# Patient Record
Sex: Female | Born: 1935 | Race: White | Hispanic: No
Health system: Southern US, Community
[De-identification: ages and names within clinical notes are randomized; demographics above are authoritative.]

---

## 2002-10-29 ENCOUNTER — Encounter: Payer: Self-pay | Admitting: Orthopedic Surgery

## 2002-11-05 ENCOUNTER — Inpatient Hospital Stay (HOSPITAL_COMMUNITY): Admission: RE | Admit: 2002-11-05 | Discharge: 2002-11-06 | Payer: Self-pay | Admitting: Orthopedic Surgery

## 2002-12-20 ENCOUNTER — Encounter: Admission: RE | Admit: 2002-12-20 | Discharge: 2003-02-07 | Payer: Self-pay | Admitting: Orthopedic Surgery

## 2003-02-17 ENCOUNTER — Encounter: Payer: Self-pay | Admitting: Family Medicine

## 2003-02-17 ENCOUNTER — Encounter: Admission: RE | Admit: 2003-02-17 | Discharge: 2003-02-17 | Payer: Self-pay | Admitting: Family Medicine

## 2003-08-13 ENCOUNTER — Ambulatory Visit (HOSPITAL_COMMUNITY): Admission: RE | Admit: 2003-08-13 | Discharge: 2003-08-13 | Payer: Self-pay | Admitting: Family Medicine

## 2003-08-13 ENCOUNTER — Encounter: Payer: Self-pay | Admitting: Family Medicine

## 2003-08-16 ENCOUNTER — Encounter: Payer: Self-pay | Admitting: Family Medicine

## 2003-08-16 ENCOUNTER — Encounter: Admission: RE | Admit: 2003-08-16 | Discharge: 2003-08-16 | Payer: Self-pay | Admitting: Family Medicine

## 2003-09-30 ENCOUNTER — Ambulatory Visit (HOSPITAL_COMMUNITY): Admission: RE | Admit: 2003-09-30 | Discharge: 2003-09-30 | Payer: Self-pay | Admitting: *Deleted

## 2003-09-30 ENCOUNTER — Encounter (INDEPENDENT_AMBULATORY_CARE_PROVIDER_SITE_OTHER): Payer: Self-pay | Admitting: Specialist

## 2003-11-24 ENCOUNTER — Encounter (INDEPENDENT_AMBULATORY_CARE_PROVIDER_SITE_OTHER): Payer: Self-pay | Admitting: Specialist

## 2003-11-24 ENCOUNTER — Encounter (INDEPENDENT_AMBULATORY_CARE_PROVIDER_SITE_OTHER): Payer: Self-pay | Admitting: *Deleted

## 2003-11-24 ENCOUNTER — Inpatient Hospital Stay (HOSPITAL_COMMUNITY): Admission: RE | Admit: 2003-11-24 | Discharge: 2003-11-27 | Payer: Self-pay | Admitting: *Deleted

## 2004-03-13 ENCOUNTER — Encounter: Admission: RE | Admit: 2004-03-13 | Discharge: 2004-04-04 | Payer: Self-pay | Admitting: Orthopedic Surgery

## 2004-03-21 ENCOUNTER — Other Ambulatory Visit: Admission: RE | Admit: 2004-03-21 | Discharge: 2004-03-21 | Payer: Self-pay | Admitting: *Deleted

## 2004-06-13 ENCOUNTER — Inpatient Hospital Stay (HOSPITAL_COMMUNITY): Admission: RE | Admit: 2004-06-13 | Discharge: 2004-06-18 | Payer: Self-pay | Admitting: Orthopedic Surgery

## 2004-06-13 ENCOUNTER — Encounter (INDEPENDENT_AMBULATORY_CARE_PROVIDER_SITE_OTHER): Payer: Self-pay | Admitting: Specialist

## 2004-06-18 ENCOUNTER — Inpatient Hospital Stay
Admission: AD | Admit: 2004-06-18 | Discharge: 2004-06-25 | Payer: Self-pay | Admitting: Physical Medicine & Rehabilitation

## 2004-12-19 ENCOUNTER — Ambulatory Visit (HOSPITAL_COMMUNITY): Admission: RE | Admit: 2004-12-19 | Discharge: 2004-12-19 | Payer: Self-pay | Admitting: *Deleted

## 2005-02-25 ENCOUNTER — Encounter: Admission: RE | Admit: 2005-02-25 | Discharge: 2005-03-14 | Payer: Self-pay | Admitting: Orthopedic Surgery

## 2005-06-12 ENCOUNTER — Inpatient Hospital Stay (HOSPITAL_COMMUNITY): Admission: RE | Admit: 2005-06-12 | Discharge: 2005-06-15 | Payer: Self-pay | Admitting: Orthopedic Surgery

## 2005-06-13 ENCOUNTER — Ambulatory Visit: Payer: Self-pay | Admitting: Physical Medicine & Rehabilitation

## 2005-06-15 ENCOUNTER — Inpatient Hospital Stay
Admission: RE | Admit: 2005-06-15 | Discharge: 2005-06-19 | Payer: Self-pay | Admitting: Physical Medicine & Rehabilitation

## 2005-06-26 IMAGING — CR DG CHEST 2V
2 series · 2 of 2 positions shown · non-contrast
Comparison: none

CLINICAL DATA: 84-year-old with history of endometrial cancer.
 CHEST - 2 VIEW:
 Two views of the chest demonstrate changes consistent with COPD with hyperinflation, attenuation of the pulmonary vasculature.  Heart size is within normal limits and mediastinal and hilar contours are normal.  No acute pulmonary findings and no pulmonary masses or nodules to suggest metastatic disease.

[view not recorded (1 of 2)]
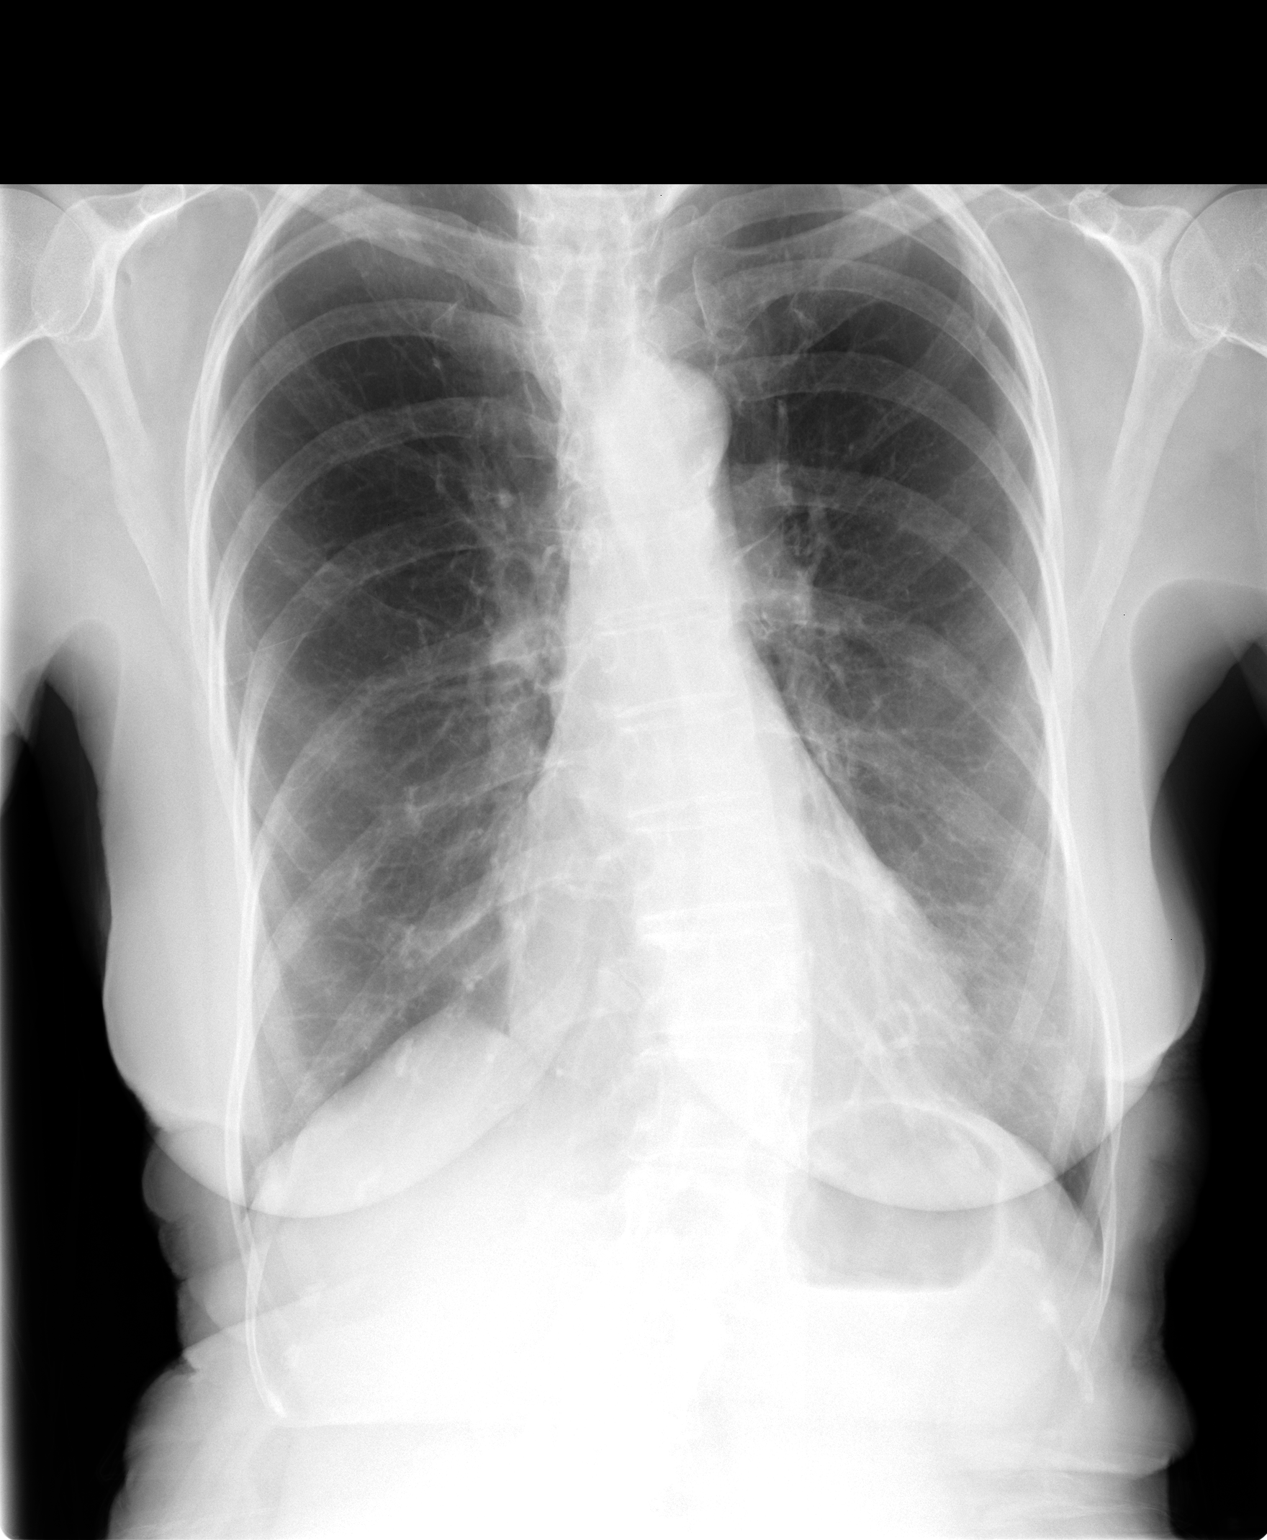

[view not recorded (2 of 2)]
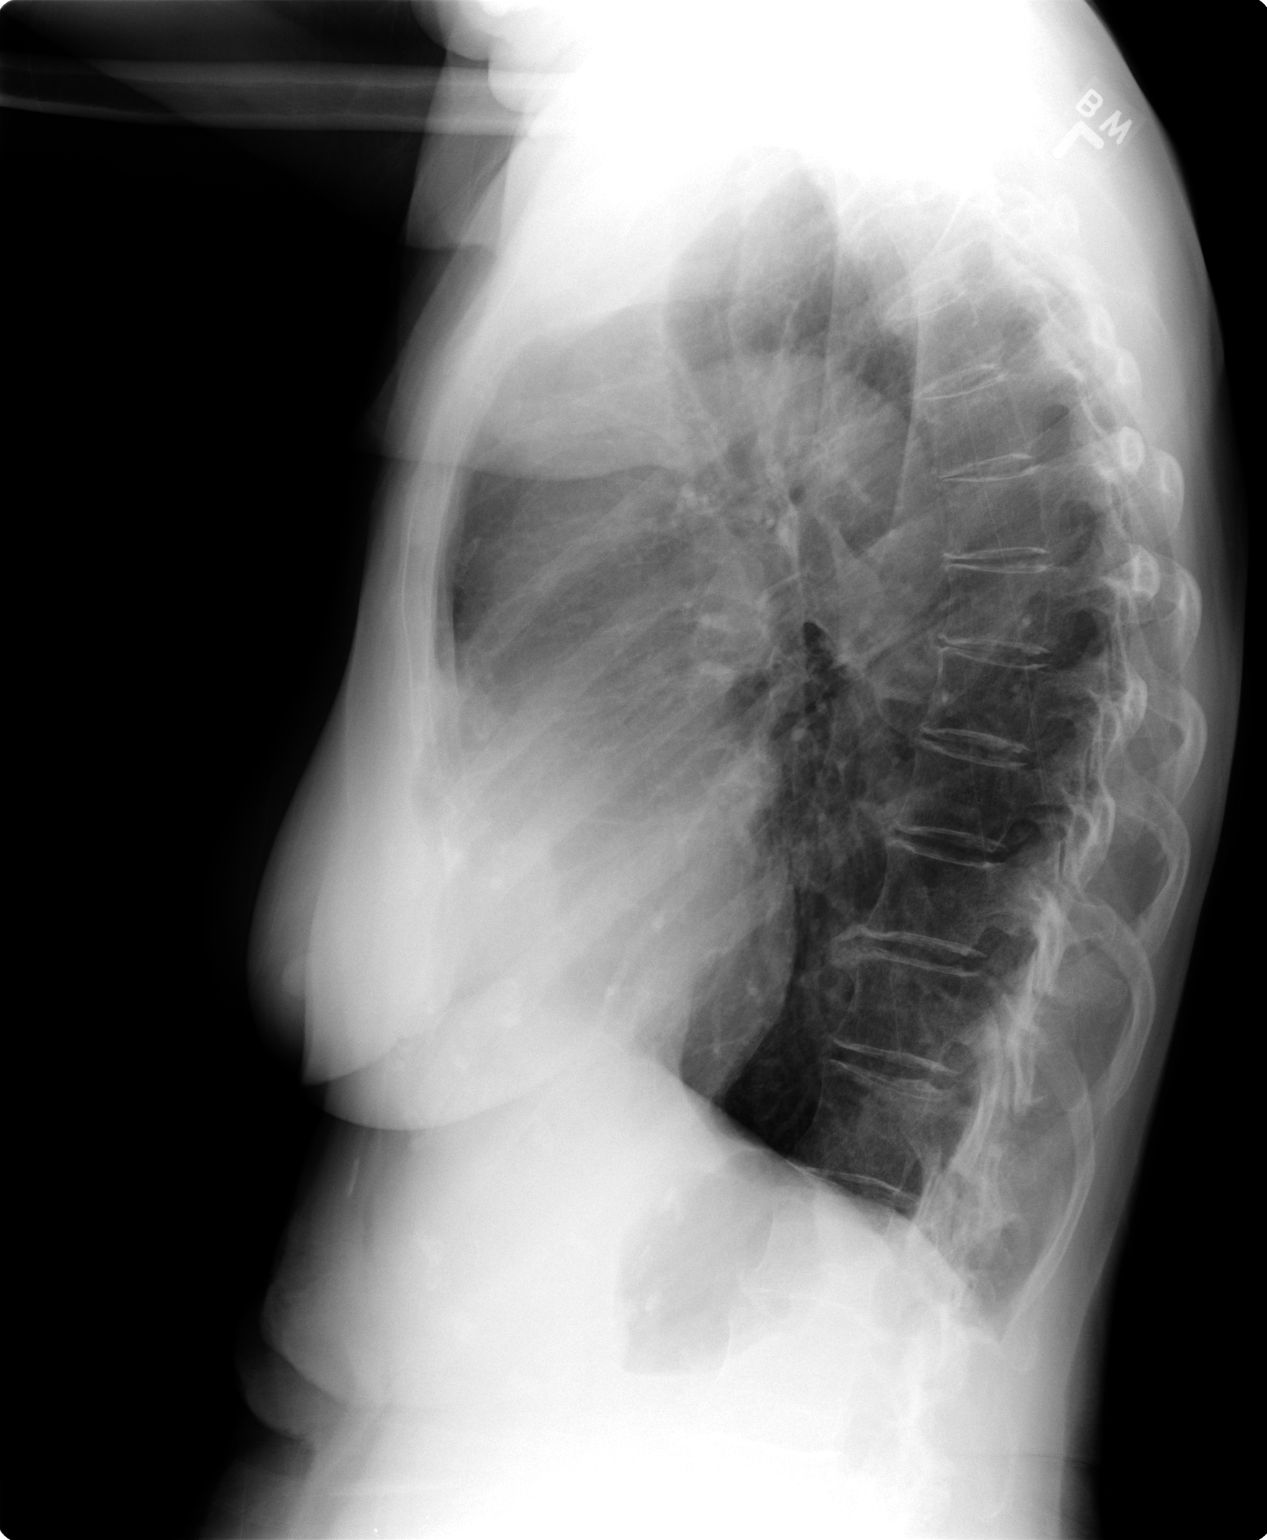

[2 of 2 positions shown; findings below may reference images not displayed]

IMPRESSION: COPD changes.  No acute pulmonary findings and no plain film evidence of metastatic disease to the chest.

## 2005-07-15 ENCOUNTER — Encounter: Admission: RE | Admit: 2005-07-15 | Discharge: 2005-07-22 | Payer: Self-pay | Admitting: Orthopedic Surgery

## 2007-03-18 ENCOUNTER — Ambulatory Visit: Payer: Self-pay | Admitting: Internal Medicine

## 2007-03-24 ENCOUNTER — Ambulatory Visit: Payer: Self-pay | Admitting: Internal Medicine

## 2008-04-06 ENCOUNTER — Encounter: Admission: RE | Admit: 2008-04-06 | Discharge: 2008-04-06 | Payer: Self-pay | Admitting: Obstetrics and Gynecology

## 2008-05-23 ENCOUNTER — Inpatient Hospital Stay (HOSPITAL_COMMUNITY): Admission: EM | Admit: 2008-05-23 | Discharge: 2008-05-25 | Payer: Self-pay | Admitting: Emergency Medicine

## 2008-05-24 ENCOUNTER — Encounter (INDEPENDENT_AMBULATORY_CARE_PROVIDER_SITE_OTHER): Payer: Self-pay | Admitting: Internal Medicine

## 2008-05-24 ENCOUNTER — Ambulatory Visit: Payer: Self-pay | Admitting: Surgery

## 2009-06-13 ENCOUNTER — Encounter: Admission: RE | Admit: 2009-06-13 | Discharge: 2009-06-13 | Payer: Self-pay | Admitting: Obstetrics and Gynecology

## 2010-01-07 ENCOUNTER — Inpatient Hospital Stay (HOSPITAL_COMMUNITY): Admission: EM | Admit: 2010-01-07 | Discharge: 2010-01-09 | Payer: Self-pay | Admitting: Emergency Medicine

## 2010-06-01 ENCOUNTER — Ambulatory Visit: Payer: Self-pay | Admitting: Internal Medicine

## 2010-06-01 ENCOUNTER — Inpatient Hospital Stay (HOSPITAL_COMMUNITY): Admission: EM | Admit: 2010-06-01 | Discharge: 2010-06-03 | Payer: Self-pay | Admitting: Emergency Medicine

## 2011-01-15 NOTE — Procedures (Signed)
Summary: Upper Endoscopy  Patient: Tamara [name] Note: All result statuses are Final unless otherwise noted.  Tests: (1) Upper Endoscopy (EGD)   EGD Upper Endoscopy       DONE     [facility]     [address] [place], [place]  [address]           ENDOSCOPY PROCEDURE REPORT           PATIENT:  Tamara [name], Tamara [name]  MR#:  [id number]     BIRTHDATE:  1920/06/09, 89 yrs. old  GENDER:  female           ENDOSCOPIST:  Iva Boop, MD, [facility]     Referred by:  [facility],           PROCEDURE DATE:  06/01/2010     PROCEDURE:  EGD with biopsy for H. pylori     ASA CLASS:  Class III     INDICATIONS:  hematemesis, melena           MEDICATIONS:   Fentanyl 50 mcg, Versed 3 mg     TOPICAL ANESTHETIC:  Cetacaine Spray           DESCRIPTION OF PROCEDURE:   After the risks benefits and     alternatives of the procedure were thoroughly explained, informed     consent was obtained.  The  endoscope was introduced through the     mouth and advanced to the second portion of the duodenum, without     limitations.  The instrument was slowly withdrawn as the mucosa     was fully examined.     <<PROCEDUREIMAGES>>           ENDOSCOPIC FINDINGS:  Esophagitis was found. 25-32 cm, with     denuded, inflamed mucosa and ulceration, especially near     gastroesophageal junction. Some of the mucosa looks columnar and     could be Barrett's.  A hiatal hernia was found. It was 8 cm in     size. 32-40 cm.  Multiple ulcers were found in the body of the     stomach. On and near diaphragmatic impingement of hiatus hernia.     Some flat pigmented changes but no visible vessels. maximum ulcer     size 7-8 mm.  Otherwise the examination was normal. A biopsy for     H. pylori was taken.    Retroflexed views revealed Retroflexion     exam demonstrated findings as previously described.    The scope     was then withdrawn from the patient and the procedure completed.           COMPLICATIONS:  None             ENDOSCOPIC IMPRESSION:     1) Esophagitis - GERD abnd severe     2) 8 cm hiatal hernia     3) Ulcers, multiple in the body of the stomach - at     diaphragmatic impingement consistent witrh Cameron's ulcers     4) Otherwise normal examination     RECOMMENDATIONS:     bid PPI x 2 months at least (start IV then po)     transfuse if needed     iron therapy - po     treat H. pylori if present     dc ASA and Celebrex     possibly resume NSAID's in 1-2 mos but only under MD direction  and must stay on PPI     consider reassessment EGD depending upon clinical issues but     probably will not need     she may have Barrett's esophagus but at 89 and with     comorbidities do not think that is an issue needing dx and would     not follow     - she may be ble to go home in next 24-48 hrs if does well           REPEAT EXAM:  In for as needed.           Iva Boop, MD, [facility]           CC:  Marjory Lies, MD           n.     eSIGNED:   Iva Boop at 06/01/2010 07:08 PM           Arman Bogus, [id number]  Note: An exclamation mark (!) indicates a result that was not dispersed into the flowsheet. Document Creation Date: 06/01/2010 7:09 PM _______________________________________________________________________  (1) Order result status: Final Collection or observation date-time: 06/01/2010 18:45 Requested date-time:  Receipt date-time:  Reported date-time:  Referring Physician:   Ordering Physician: Stan Head [phone]) Specimen Source:  Source: Launa Grill Order Number: [id number] Lab site:

## 2011-03-03 LAB — BASIC METABOLIC PANEL
BUN: 42 mg/dL — ABNORMAL HIGH (ref 6–23)
BUN: 45 mg/dL — ABNORMAL HIGH (ref 6–23)
CO2: 21 mEq/L (ref 19–32)
CO2: 23 mEq/L (ref 19–32)
Calcium: 8.6 mg/dL (ref 8.4–10.5)
Calcium: 8.8 mg/dL (ref 8.4–10.5)
Chloride: 106 mEq/L (ref 96–112)
Creatinine, Ser: 2.11 mg/dL — ABNORMAL HIGH (ref 0.4–1.2)
GFR calc Af Amer: 32 mL/min — ABNORMAL LOW (ref >60)
GFR calc non Af Amer: 22 mL/min — ABNORMAL LOW (ref >60)
Glucose, Bld: 127 mg/dL — ABNORMAL HIGH (ref 70–99)
Potassium: 3.4 mEq/L — ABNORMAL LOW (ref 3.5–5.1)
Potassium: 5.6 mEq/L — ABNORMAL HIGH (ref 3.5–5.1)
Sodium: 141 mEq/L (ref 135–145)

## 2011-03-03 LAB — CBC
HCT: 29.4 % — ABNORMAL LOW (ref 36.0–46.0)
HCT: 32.8 % — ABNORMAL LOW (ref 36.0–46.0)
HCT: 28.3 % — ABNORMAL LOW (ref 36.0–46.0)
HCT: 28.4 % — ABNORMAL LOW (ref 36.0–46.0)
Hemoglobin: 10 g/dL — ABNORMAL LOW (ref 12.0–15.0)
Hemoglobin: 9.8 g/dL — ABNORMAL LOW (ref 12.0–15.0)
Hemoglobin: 10.4 g/dL — ABNORMAL LOW (ref 12.0–15.0)
Hemoglobin: 9.3 g/dL — ABNORMAL LOW (ref 12.0–15.0)
MCHC: 33.9 g/dL (ref 30.0–36.0)
MCHC: 34.2 g/dL (ref 30.0–36.0)
MCHC: 32.8 g/dL (ref 30.0–36.0)
MCHC: 33.2 g/dL (ref 30.0–36.0)
MCHC: 33.3 g/dL (ref 30.0–36.0)
MCV: 92.8 fL (ref 78.0–100.0)
MCV: 88.1 fL (ref 78.0–100.0)
MCV: 88.7 fL (ref 78.0–100.0)
MCV: 89.9 fL (ref 78.0–100.0)
Platelets: 136 10*3/uL — ABNORMAL LOW (ref 150–400)
Platelets: 147 10*3/uL — ABNORMAL LOW (ref 150–400)
Platelets: 164 10*3/uL (ref 150–400)
RBC: 3.19 MIL/uL — ABNORMAL LOW (ref 3.87–5.11)
RBC: 3.54 MIL/uL — ABNORMAL LOW (ref 3.87–5.11)
RBC: 3.17 MIL/uL — ABNORMAL LOW (ref 3.87–5.11)
RBC: 3.23 MIL/uL — ABNORMAL LOW (ref 3.87–5.11)
RBC: 3.54 MIL/uL — ABNORMAL LOW (ref 3.87–5.11)
RDW: 13 % (ref 11.5–15.5)
RDW: 13.3 % (ref 11.5–15.5)
RDW: 13.3 % (ref 11.5–15.5)
WBC: 9.1 10*3/uL (ref 4.0–10.5)
WBC: 8.1 10*3/uL (ref 4.0–10.5)

## 2011-03-03 LAB — CROSSMATCH: Antibody Screen: NEGATIVE

## 2011-03-03 LAB — IRON AND TIBC
Iron: 57 ug/dL (ref 42–135)
TIBC: 302 ug/dL (ref 250–470)
UIBC: 245 ug/dL

## 2011-03-03 LAB — DIFFERENTIAL
Basophils Absolute: 0.1 10*3/uL (ref 0.0–0.1)
Basophils Absolute: 0.1 10*3/uL (ref 0.0–0.1)
Basophils Relative: 1 % (ref 0–1)
Basophils Relative: 1 % (ref 0–1)
Eosinophils Absolute: 0.3 10*3/uL (ref 0.0–0.7)
Lymphocytes Relative: 24 % (ref 12–46)
Monocytes Absolute: 1.1 10*3/uL — ABNORMAL HIGH (ref 0.1–1.0)
Monocytes Relative: 9 % (ref 3–12)
Neutro Abs: 5 10*3/uL (ref 1.7–7.7)
Neutrophils Relative %: 63 % (ref 43–77)

## 2011-03-03 LAB — FERRITIN: Ferritin: 22 ng/mL (ref 10–291)

## 2011-03-03 LAB — LIPID PANEL
LDL Cholesterol: 85 mg/dL (ref 0–99)
Triglycerides: 134 mg/dL (ref <150)
VLDL: 27 mg/dL (ref 0–40)

## 2011-03-03 LAB — COMPREHENSIVE METABOLIC PANEL
Alkaline Phosphatase: 38 U/L — ABNORMAL LOW (ref 39–117)
BUN: 18 mg/dL (ref 6–23)
CO2: 28 mEq/L (ref 19–32)
Calcium: 8.9 mg/dL (ref 8.4–10.5)
Calcium: 9.1 mg/dL (ref 8.4–10.5)
Creatinine, Ser: 1.45 mg/dL — ABNORMAL HIGH (ref 0.4–1.2)
GFR calc non Af Amer: 34 mL/min — ABNORMAL LOW (ref >60)
Glucose, Bld: 102 mg/dL — ABNORMAL HIGH (ref 70–99)
Glucose, Bld: 90 mg/dL (ref 70–99)
Total Protein: 5.7 g/dL — ABNORMAL LOW (ref 6.0–8.3)

## 2011-03-03 LAB — RETICULOCYTES: Retic Count, Absolute: 36.1 10*3/uL (ref 19.0–186.0)

## 2011-03-03 LAB — CK TOTAL AND CKMB (NOT AT ARMC)
CK, MB: 0.9 ng/mL (ref 0.3–4.0)
CK, MB: 1.3 ng/mL (ref 0.3–4.0)
Relative Index: INVALID (ref 0.0–2.5)
Relative Index: INVALID (ref 0.0–2.5)
Total CK: 40 U/L (ref 7–177)
Total CK: 54 U/L (ref 7–177)
Total CK: 74 U/L (ref 7–177)

## 2011-03-03 LAB — POTASSIUM: Potassium: 4.3 mEq/L (ref 3.5–5.1)

## 2011-03-03 LAB — ABO/RH: ABO/RH(D): O POS

## 2011-03-03 LAB — PROTIME-INR: INR: 1.01 (ref 0.00–1.49)

## 2011-04-06 ENCOUNTER — Inpatient Hospital Stay (HOSPITAL_COMMUNITY)
Admission: EM | Admit: 2011-04-06 | Discharge: 2011-04-13 | DRG: 194 | Disposition: A | Discharge status: Skilled Nursing Facility | Payer: Medicare Other | Attending: Family Medicine | Admitting: Family Medicine

## 2011-04-06 ENCOUNTER — Emergency Department (HOSPITAL_COMMUNITY): Payer: Medicare Other

## 2011-04-06 DIAGNOSIS — K5900 Constipation, unspecified: Secondary | ICD-10-CM | POA: Diagnosis present

## 2011-04-06 DIAGNOSIS — J189 Pneumonia, unspecified organism: Principal | ICD-10-CM | POA: Diagnosis present

## 2011-04-06 DIAGNOSIS — I5030 Unspecified diastolic (congestive) heart failure: Secondary | ICD-10-CM | POA: Diagnosis present

## 2011-04-06 DIAGNOSIS — I10 Essential (primary) hypertension: Secondary | ICD-10-CM | POA: Diagnosis present

## 2011-04-06 DIAGNOSIS — G309 Alzheimer's disease, unspecified: Secondary | ICD-10-CM | POA: Diagnosis present

## 2011-04-06 DIAGNOSIS — Z79899 Other long term (current) drug therapy: Secondary | ICD-10-CM

## 2011-04-06 DIAGNOSIS — E871 Hypo-osmolality and hyponatremia: Secondary | ICD-10-CM | POA: Diagnosis present

## 2011-04-06 DIAGNOSIS — E876 Hypokalemia: Secondary | ICD-10-CM | POA: Diagnosis present

## 2011-04-06 DIAGNOSIS — Z7982 Long term (current) use of aspirin: Secondary | ICD-10-CM

## 2011-04-06 DIAGNOSIS — K219 Gastro-esophageal reflux disease without esophagitis: Secondary | ICD-10-CM | POA: Diagnosis present

## 2011-04-06 DIAGNOSIS — M1990 Unspecified osteoarthritis, unspecified site: Secondary | ICD-10-CM | POA: Diagnosis present

## 2011-04-06 DIAGNOSIS — I509 Heart failure, unspecified: Secondary | ICD-10-CM | POA: Diagnosis present

## 2011-04-06 DIAGNOSIS — E878 Other disorders of electrolyte and fluid balance, not elsewhere classified: Secondary | ICD-10-CM | POA: Diagnosis present

## 2011-04-06 DIAGNOSIS — J4489 Other specified chronic obstructive pulmonary disease: Secondary | ICD-10-CM | POA: Diagnosis present

## 2011-04-06 DIAGNOSIS — R339 Retention of urine, unspecified: Secondary | ICD-10-CM | POA: Diagnosis present

## 2011-04-06 DIAGNOSIS — J449 Chronic obstructive pulmonary disease, unspecified: Secondary | ICD-10-CM | POA: Diagnosis present

## 2011-04-06 DIAGNOSIS — F0280 Dementia in other diseases classified elsewhere without behavioral disturbance: Secondary | ICD-10-CM | POA: Diagnosis present

## 2011-04-06 LAB — CBC
MCH: 28.9 pg (ref 26.0–34.0)
MCHC: 33.5 g/dL (ref 30.0–36.0)
MCV: 86.2 fL (ref 78.0–100.0)
Platelets: 172 10*3/uL (ref 150–400)
RDW: 13.5 % (ref 11.5–15.5)
WBC: 15.4 10*3/uL — ABNORMAL HIGH (ref 4.0–10.5)

## 2011-04-06 LAB — COMPREHENSIVE METABOLIC PANEL
Albumin: 3 g/dL — ABNORMAL LOW (ref 3.5–5.2)
BUN: 23 mg/dL (ref 6–23)
Calcium: 9.4 mg/dL (ref 8.4–10.5)
Creatinine, Ser: 1.42 mg/dL — ABNORMAL HIGH (ref 0.4–1.2)
Potassium: 4.3 mEq/L (ref 3.5–5.1)
Total Protein: 7 g/dL (ref 6.0–8.3)

## 2011-04-06 LAB — DIFFERENTIAL
Eosinophils Absolute: 0.1 10*3/uL (ref 0.0–0.7)
Eosinophils Relative: 0 % (ref 0–5)
Lymphs Abs: 1.5 10*3/uL (ref 0.7–4.0)
Monocytes Absolute: 1.8 10*3/uL — ABNORMAL HIGH (ref 0.1–1.0)

## 2011-04-07 LAB — CBC
HCT: 30.5 % — ABNORMAL LOW (ref 36.0–46.0)
MCV: 86.6 fL (ref 78.0–100.0)
RBC: 3.52 MIL/uL — ABNORMAL LOW (ref 3.87–5.11)
WBC: 14.7 10*3/uL — ABNORMAL HIGH (ref 4.0–10.5)

## 2011-04-07 LAB — BASIC METABOLIC PANEL
BUN: 22 mg/dL (ref 6–23)
Chloride: 96 mEq/L (ref 96–112)
Glucose, Bld: 141 mg/dL — ABNORMAL HIGH (ref 70–99)
Potassium: 4 mEq/L (ref 3.5–5.1)
Sodium: 132 mEq/L — ABNORMAL LOW (ref 135–145)

## 2011-04-07 LAB — TSH: TSH: 1.351 u[IU]/mL (ref 0.350–4.500)

## 2011-04-07 LAB — BRAIN NATRIURETIC PEPTIDE: Pro B Natriuretic peptide (BNP): 272 pg/mL — ABNORMAL HIGH (ref 0.0–100.0)

## 2011-04-08 ENCOUNTER — Inpatient Hospital Stay (HOSPITAL_COMMUNITY): Payer: Medicare Other

## 2011-04-08 LAB — CBC
HCT: 29.6 % — ABNORMAL LOW (ref 36.0–46.0)
Hemoglobin: 9.8 g/dL — ABNORMAL LOW (ref 12.0–15.0)
MCHC: 33.1 g/dL (ref 30.0–36.0)
RDW: 13.3 % (ref 11.5–15.5)
WBC: 12.5 10*3/uL — ABNORMAL HIGH (ref 4.0–10.5)

## 2011-04-08 LAB — BASIC METABOLIC PANEL
Calcium: 8 mg/dL — ABNORMAL LOW (ref 8.4–10.5)
GFR calc Af Amer: 46 mL/min — ABNORMAL LOW (ref >60)
GFR calc non Af Amer: 38 mL/min — ABNORMAL LOW (ref >60)
Glucose, Bld: 127 mg/dL — ABNORMAL HIGH (ref 70–99)
Potassium: 3.7 mEq/L (ref 3.5–5.1)
Sodium: 130 mEq/L — ABNORMAL LOW (ref 135–145)

## 2011-04-09 LAB — CBC
HCT: 30.2 % — ABNORMAL LOW (ref 36.0–46.0)
Hemoglobin: 10.1 g/dL — ABNORMAL LOW (ref 12.0–15.0)
MCV: 86 fL (ref 78.0–100.0)
RBC: 3.51 MIL/uL — ABNORMAL LOW (ref 3.87–5.11)
WBC: 11.4 10*3/uL — ABNORMAL HIGH (ref 4.0–10.5)

## 2011-04-09 LAB — BASIC METABOLIC PANEL
BUN: 13 mg/dL (ref 6–23)
CO2: 24 mEq/L (ref 19–32)
Chloride: 97 mEq/L (ref 96–112)
GFR calc non Af Amer: 48 mL/min — ABNORMAL LOW (ref >60)
Glucose, Bld: 122 mg/dL — ABNORMAL HIGH (ref 70–99)
Potassium: 3.5 mEq/L (ref 3.5–5.1)
Sodium: 130 mEq/L — ABNORMAL LOW (ref 135–145)

## 2011-04-10 ENCOUNTER — Inpatient Hospital Stay (HOSPITAL_COMMUNITY): Payer: Medicare Other

## 2011-04-10 DIAGNOSIS — I319 Disease of pericardium, unspecified: Secondary | ICD-10-CM

## 2011-04-10 LAB — HEPATIC FUNCTION PANEL
ALT: 54 U/L — ABNORMAL HIGH (ref 0–35)
AST: 44 U/L — ABNORMAL HIGH (ref 0–37)
Bilirubin, Direct: 0.2 mg/dL (ref 0.0–0.3)
Indirect Bilirubin: 0.5 mg/dL (ref 0.3–0.9)
Total Bilirubin: 0.7 mg/dL (ref 0.3–1.2)

## 2011-04-10 LAB — BASIC METABOLIC PANEL
BUN: 12 mg/dL (ref 6–23)
CO2: 25 mEq/L (ref 19–32)
Calcium: 8.1 mg/dL — ABNORMAL LOW (ref 8.4–10.5)
GFR calc non Af Amer: 48 mL/min — ABNORMAL LOW (ref >60)
Glucose, Bld: 112 mg/dL — ABNORMAL HIGH (ref 70–99)
Sodium: 130 mEq/L — ABNORMAL LOW (ref 135–145)

## 2011-04-10 LAB — CBC
HCT: 27.8 % — ABNORMAL LOW (ref 36.0–46.0)
Hemoglobin: 9.3 g/dL — ABNORMAL LOW (ref 12.0–15.0)
MCHC: 33.5 g/dL (ref 30.0–36.0)
MCV: 86.6 fL (ref 78.0–100.0)
RDW: 13.3 % (ref 11.5–15.5)

## 2011-04-11 LAB — COMPREHENSIVE METABOLIC PANEL
CO2: 29 mEq/L (ref 19–32)
Calcium: 8.5 mg/dL (ref 8.4–10.5)
Creatinine, Ser: 1.13 mg/dL (ref 0.4–1.2)
GFR calc Af Amer: 55 mL/min — ABNORMAL LOW (ref >60)
GFR calc non Af Amer: 45 mL/min — ABNORMAL LOW (ref >60)
Glucose, Bld: 108 mg/dL — ABNORMAL HIGH (ref 70–99)
Total Protein: 5.7 g/dL — ABNORMAL LOW (ref 6.0–8.3)

## 2011-04-12 LAB — BASIC METABOLIC PANEL
CO2: 33 mEq/L — ABNORMAL HIGH (ref 19–32)
Chloride: 95 mEq/L — ABNORMAL LOW (ref 96–112)
GFR calc Af Amer: 53 mL/min — ABNORMAL LOW (ref >60)
Potassium: 4 mEq/L (ref 3.5–5.1)

## 2011-04-13 LAB — CULTURE, BLOOD (ROUTINE X 2)
Culture  Setup Time: 201204221124
Culture: NO GROWTH

## 2011-04-13 NOTE — Consult Note (Signed)
  NAME:  Tamara [name], SCHAUS                  ACCOUNT NO.:  [id number]  MEDICAL RECORD NO.:  [id number]           PATIENT TYPE:  I  LOCATION:  1532                         FACILITY:  [facility]  PHYSICIAN:  Danae Chen, M.D.  DATE OF BIRTH:  04-14-1920  DATE OF CONSULTATION:  04/12/2011 DATE OF DISCHARGE:                                CONSULTATION   REASON FOR CONSULTATION:  Inability to urinate.  HISTORY OF PRESENT ILLNESS:  The patient is an 77 years old female, who was admitted with shortness of breath, cough and fever.  She has dementia and went into urinary retention during her hospitalization. She failed two voiding trials.  She was in-and-out catheterized at this morning for 400 mL of urine.  I was then asked to see the patient in consultation for further management.  The patient had been having difficulty voiding prior to the hospital stay.  She was voiding only small amount of urine at a time and she has dementia.  Cannot get any history from her.  All history is obtained from the history and physical in the chart.  PAST MEDICAL HISTORY:  Positive for hypertension, COPD, dementia, CHF.  ALLERGIES:  ALLERGIC TO MORPHINE.  MEDICATIONS:  Aspirin, atenolol, lorazepam, Maxzide, Namenda and Rozerem .  SOCIAL HISTORY:  She does not smoke nor drink.  She is a widow.  FAMILY HISTORY:  Her parents are deceased.  REVIEW OF SYSTEMS:  As noted in the HPI.  PHYSICAL EXAMINATION:  GENERAL:  This is a pleasant 76 year old female, who is in no acute distress. VITAL SIGNS:  Her blood pressure is 134/80, pulse 87, respirations 18, temperature 98.4. SKIN:  Warm and dry. ABDOMEN:  Soft and nondistended, nontender.  She has no CVA tenderness. Kidneys are not palpable.  She has no hepatomegaly, no splenomegaly. Bladder is not distended at this time.  There is no inguinal hernia.  No inguinal adenopathy.  IMPRESSION:  Urinary retention, dementia.  SUGGESTIONS:  Start a small dose of Urecholine.   Reinsert Foley catheter if the patient unable to void.  Then, she will get a voiding trial next week.  If the patient is ready for discharge I will follow her as an outpatient.     Danae Chen, M.D.     MN/MEDQ  D:  04/12/2011  T:  04/13/2011  Job:  [id number]  Electronically Signed by Lindaann Slough M.D. on 04/13/2011 11:13:58 AM

## 2011-04-21 NOTE — H&P (Signed)
NAME:  Tamara [name], Tamara [name]                  ACCOUNT NO.:  [id number]  MEDICAL RECORD NO.:  [id number]           PATIENT TYPE:  I  LOCATION:  1532                         FACILITY:  [facility]  PHYSICIAN:  Lonia Blood, M.D.      DATE OF BIRTH:  Mar 12, 1920  DATE OF ADMISSION:  04/06/2011 DATE OF DISCHARGE:                             HISTORY & PHYSICAL   PRIMARY CARE PHYSICIAN:  Marjory Lies, M.D.  PRESENTING COMPLAINT:  Cough, shortness of breath, and fever.  HISTORY OF PRESENT ILLNESS:  The patient is an 77 year old female with multiple medical problems including dementia, who is being taking care of at home by her daughters.  She has apparently been doing fine.  Even though she is demented, she is able to do most of her ADLs with some help.  She started having cough, shortness of breath, and fever at home. This has been going on since Thursday of last week.  Family were worried that she is getting worse, sicker, and her was temperature 101 at home. EMS were called and they arrived.  When they arrived, her oxygen saturation was 92% at home.  Her temperature is 100.1.  No chest pain. Her cough is nonproductive in nature and the shortness of breath is at rest and has no bearing with exertion.  EMS transported her to the ED for further treatment.  PAST MEDICAL HISTORY: 1. Alzheimer dementia. 2. Hypertension. 3. COPD. 4. CHF, diastolic dysfunction. 5. History of esophagitis. 6. GERD. 7. History of Helicobacter pylori gastropathy recently. 8. Osteoarthritis.  ALLERGIES:  MORPHINE that causes nausea and vomiting.  MEDICATIONS: 1. Aspirin 81 mg daily. 2. Atenolol 50 mg daily, 3. Iron 365 mg daily. 4. Lorazepam 0.5 mg p.r.n. 5. Maxzide 75/50 one tablet daily. 6. Namenda 10 mg twice a day. 7. Rozerem 8 mg nightly.  SOCIAL HISTORY:  The patient lives in [place].  She has 3 grown up daughters and 24-hour care.  No tobacco, alcohol, or IV drug use.  She is a widow.  FAMILY HISTORY:   Noncontributory.  REVIEW OF SYSTEMS:  All system reviewed are currently negative except per HPI.  PHYSICAL EXAMINATION:  VITAL SIGNS:  Her temperature is 98.1, blood pressure 119/62 with a pulse 94, respiratory rate 18, and her saturation is 100% on 2 L. GENERAL:  The patient is awake, alert, she seems to be oriented x1, communicating, calm, noncombative, she is in no acute distress. HEENT:  PERRLA.  EOMI.  She is edentulous.  No pallor.  No jaundice.  No rhinorrhea. NECK:  Supple.  No JVD, no lymphadenopathy. RESPIRATORY:  Shows mildly decreased air entry bilaterally.  She has some mild crackles also bilaterally, but no wheezes, no rales. CARDIOVASCULAR:  Shows S1 and S2, no audible murmur. ABDOMEN:  Soft, full, nontender with positive bowel sounds. EXTREMITIES:  No edema, cyanosis, or clubbing. SKIN:  No visible rashes, ulcers, or bruises.  LABORATORY DATA:  White count is 15,400 with a left shift ANC of 12.1, hemoglobin 11.7 with an MCV of 86, her platelet count is 172.  Sodium is 132, potassium 4.3, chloride 94, CO2 is 28,  glucose 130, BUN 23, creatinine 1.42.  Her estimated GFR there was 35.  Her total bilirubin is 1.5, alkaline phosphatase 167, AST 90, ALT 139, total protein was 7.0, albumin 3.0, and calcium 9.4.  Her chest x-ray showed no right upper lobe pneumonia and cardiomegaly.  ASSESSMENT:  This is an 77 year old female coming from home with right upper lung pneumonia.  More than likely, this represents community- acquired pneumonia.  There is no evidence of sick contacts at home.  PLAN: 1. Community-acquired pneumonia.  We will admit the patient to regular     floor.  Start on Rocephin and Zithromax IV based on the pneumonia     protocol.  Hydrate her.  Once she is much improved, we will     transition her to oral antibiotics, probably of quinolone before     discharge home. 2. Dementia.  She is stable.  We will continue with her Namenda. 3. Hypertension.  Blood  pressure seems reasonable.  Continue her home     medication. 4. COPD.  The patient is not wheezing at this point.  I will put on     heparin nebulizers. 5. Increased in LFTs, it is not sure why, could be related to her     dehydration.  More than like, also the history of CHF, but she is     not having any fluid overload at this point to suggest hepatic     congestion.  We will recheck her LFTs in the morning and follow     closely. 6. CHF.  This diastolic dysfunction seems compensated at this time. 7. GERD.  We will continue with PPI at this point. 8. Hypokalemia.  This is mild.  We will treat it appropriately. 9. Osteoarthritis.  Again, continue to treat this conservatively. 10.Dehydration.  The patient has hyponatremia, hypochloremia.  I will     give her some saline cautiously.     Lonia Blood, M.D.    Verlin Grills  D:  04/07/2011  T:  04/07/2011  Job:  [id number]  Electronically Signed by Lonia Blood M.D. on 04/21/2011 10:07:58 PM

## 2011-04-24 NOTE — Discharge Summary (Signed)
Tamara [name], Tamara [name]                  ACCOUNT NO.:  [id number]  MEDICAL RECORD NO.:  [id number]           PATIENT TYPE:  LOCATION:                                 FACILITY:  PHYSICIAN:  Mauro Kaufmann, MD         DATE OF BIRTH:  23-Dec-1919  DATE OF ADMISSION:  04/07/2011 DATE OF DISCHARGE:  04/12/2011                              DISCHARGE SUMMARY   ADMISSION DIAGNOSES: 1. Community-acquired pneumonia. 2. Dementia. 3. Hypertension. 4. Chronic obstructive pulmonary disease. 5. Elevated LFTs secondary to congestive heart failure. 6. Congestive heart failure. 7. Gastroesophageal reflux disease. 8. Hypokalemia.  DISCHARGED DIAGNOSES: 1. Community-acquired pneumonia, improved, currently on antibiotics. 2. Congestive heart failure, diastolic dysfunction, improving.  The     BNP came down from 272 to 186 as of April 10, 2011, with excellent     response to diuretics. 3. Mild transaminitis, resolved. 4. Chronic kidney disease, stable.  PROCEDURES PERFORMED:  Tests performed during the hospital stay include, 1. Chest x-ray on April 06, 2011, showed right upper lobe pneumonia. 2. Renal ultrasound, April 08, 2011, showed kidney normal size, no     hydronephrosis or acute pathology. 3. X-ray of abdomen showed normal stool and bowel gas pattern. 4. Modified barium swallow done.  Recommendation with soft, thin with     full supervision and aspiration, reflux precautions.  BRIEF HISTORY AND PHYSICAL:  An 77 year old female with multiple medical problems of dementia who has been taken care by her daughter at home. The patient was doing fine and was able to do most of the ADLs with some help.  The patient was admitted to the hospital because of the cough and shortness of breath.  The cough was nonproductive in nature and shortness of breath was at rest and no breathing with exertion.  The patient was found to have pneumonia, was admitted with pneumonia, started on IV antibiotics.  Also, the  patient had high white count on the admission.  BRIEF HOSPITAL COURSE: 1. Community-acquired pneumonia.  The patient was started on Rocephin     and Zithromax.  Her white count on the day of admission was 14.7,     which came down nicely to 9.4 as of April 10, 2011 and also, the     patient has been afebrile.  At this time, she has received IV     antibiotics including ceftriaxone and Zithromax in the hospital.  I     am going to send the patient home on Levaquin, which she will take     for 7 more days. 2. CHF (diastolic dysfunction).  The patient had an echocardiogram,     which showed EF of 65%.  Also, the patient's BNP was elevated to     270 and after the diuresis, the patient's BNP brought to 186.  The     patient had excellent diuretic response and diuresed more than 2 L     as of April 10, 2011 and April 11, 2011.  At this time, the patient     to is stable, though she is switched back to  p.o. Lasix and she is     currently on Lasix 40 mg p.o. daily and I will continue the patient     on this dose as the patient still is requiring oxygen and should be     maintained on this dose of Lasix.  The patient's BMET should be     checked in next 2 to 3 weeks to make sure that the patient's renal     function has not worsened while being on Lasix.  This can be done     at the nursing home.  In case that happens, then Lasix needs to be     cut down to 20 mg p.o. daily depending upon the clinical response     as per the discretion of the nursing home physician.  In the     meantime, the patient will be continued on oxygen therapy at the     nursing home. 3. Constipation.  During the hospitalization, the patient had     constipation, which was relieved after the patient was put on     MiraLax.  The patient takes Colace at home and she will be     continued on that. 4. Urinary retention.  During the hospitalization, the patient also     had developed urinary retention, 2 voiding trials  were done, which     the patient failed.  So an Urology consultation was warranted and     Urology saw the patient, Dr. Brunilda Payor has already seen the patient and     started the patient on Urecholine and also recommend to insert a     Foley catheter and follow up as outpatient if the patient unable to     urinate.  At this time, the patient has been started on bethanechol     and we are going to insert the Foley catheter at this time and the     voiding trial should be done in 3 days at the nursing home.  I have     discussed this with the daughter.  On Monday April 15, 2011, a     voiding trial should be done and if the patient is still unable to     urinate, then Foley catheter should be reinserted and the patient     should see Dr. Brunilda Payor as outpatient. 5. Dementia.  The patient will continue to use the Exelon and Namenda. 6. Hypertension.  The patient's blood pressure is stable and she will     be continued on her antihypertensive medications, which is atenolol     50 mg p.o. daily.  DISCHARGE MEDICATIONS:  Medications on discharge include, 1. Bethanechol 10 mg p.o. every 6 hours. 2. Furosemide 40 mg p.o. daily. 3. Levofloxacin 250 mg p.o. q.24 h. x 7 days 4. Potassium chloride 20 mEq p.o. daily. 5. Acetaminophen 325 mg two tablets twice daily. 6. Aspirin enteric-coated 81 mg p.o. daily. 7. Atenolol 50 mg one p.o. morning. 8. Docusate 50 mg two capsules by mouth daily at bedtime as needed. 9. Fish oil 1200 mg one capsule p.o. b.i.d. 10.Iron 65 mg over-the-counter 1 tablet p.o. every other day. 11.Lorazepam 0.5 mg half tablet by mouth twice daily as needed. 12.Mirtazapine 50 mg 1 tablet p.o. daily at bedtime. 13.Multivitamin one tablet p.o. every morning. 14.Namenda 10 mg twice a day. 15.Prilosec 40 mg p.o. daily. 16.Rozerem 8 mg 1 tablet p.o. daily at bedtime. 17.Spiriva 18 mcg one capsule inhaled daily. 18.Vitamin D3 of 1000  units tablet in the morning.  Please note that I have  stopped the patient's Maxzide at this time because the patient will be sent home on Lasix.     Mauro Kaufmann, MD     GL/MEDQ  D:  04/12/2011  T:  04/12/2011  Job:  [id number]  cc:   Lindaann Slough, M.D. Anselm Jungling, MD  Electronically Signed by Mauro Kaufmann  on 04/24/2011 04:36:56 PM

## 2011-04-24 NOTE — Discharge Summary (Signed)
  NAME:  Rawling, Serenitee                  ACCOUNT NO.:  [id number]  MEDICAL RECORD NO.:  [id number]           PATIENT TYPE:  I  LOCATION:  1532                         FACILITY:  [facility]  PHYSICIAN:  Mauro Kaufmann, MD         DATE OF BIRTH:  Mar 21, 1920  DATE OF ADMISSION:  04/06/2011 DATE OF DISCHARGE:                              DISCHARGE SUMMARY   ADDENDUM: Please make a note that the patient's discharge medication antibiotic, Levaquin dose has been changed to 250 mg p.o. daily for next 7 days. Please make a note that the dose is 250 mg Levaquin p.o. daily for next 7 days and then stop.     Mauro Kaufmann, MD     GL/MEDQ  D:  04/13/2011  T:  04/13/2011  Job:  [id number]  cc:   Marjory Lies, M.D. Lindaann Slough, M.D.  Electronically Signed by Mauro Kaufmann  on 04/24/2011 04:36:23 PM

## 2011-04-30 NOTE — Discharge Summary (Signed)
Tamara [name], Tamara [name]                  ACCOUNT NO.:  [id number]   MEDICAL RECORD NO.:  [id number]          PATIENT TYPE:  INP   LOCATION:  4730                         FACILITY:  [facility]   PHYSICIAN:  Beckey Rutter, MD  DATE OF BIRTH:  1920-11-15   DATE OF ADMISSION:  05/23/2008  DATE OF DISCHARGE:  05/25/2008                               DISCHARGE SUMMARY   PRIMARY CARE PHYSICIAN:  Marjory Lies, MD, [facility].   CHIEF COMPLAINT:  Fall/near syncope.   HOSPITAL COURSE:  1. Fall/near syncope.  The patient was evaluated for near syncope with      negative workup.  The carotid Doppler is negative.  The telemetry      monitoring is negative.  The CT scan and MRI is negative as below.      It was felt the near syncope and fall is secondary to      dehydration/severe dehydration, which was secondary to diarrhea and      sickness the patient experienced before she fell.  2. Mild rhabdomyolysis with a slight elevation in CPK.  The patient is      stable to discharge, advised to drink a lot of water.  3. Dementia, forgetfulness.  The patient has questionable dementia,      but mini-mental state is not evaluated.  At this time, I will      discontinue the Aricept, and the family wanted to follow up with      Dr. Doristine Counter for further management.  4. Hypertension and microvascular change in the brain.  I will      prescribe the patient Zocor and will send the patient home with      visiting nurse for further evaluation of her hypertension.  The      patient does not want to remain in the hospital.  I will release      the patient today with the recommendation to perform an EEG as an      outpatient and to monitor the effect of Zocor and liver function      test.  For hypertension, as discussed above, the visiting nurse      will continue to monitor blood pressure.  The patient is eager for      discharge today and those tests could be done as an outpatient as      discussed  with her and her daughter.  The patient and family is      aware and agreeable to discharge plan.   DISCHARGE MEDICATIONS:  1. Zocor 20 mg p.o. bedtime.  2. Atenolol 50 mg daily.  3. Triamterene-hydrochlorothiazide 75/50 daily.  4. Megestrol 40 mg p.o. daily.  5. Temazepam 15 mg as needed.  6. Xanax 0.5 mg as needed.  7. Aspirin 81 mg daily.  8. Centrum Silver one tablet daily.  9. Zocor 20 mg p.o. bedtime.   DISCHARGE DIAGNOSIS:  Near syncope/fall secondary to severe dehydration  after diarrhea.   SECONDARY DIAGNOSES:  1. Hypertension.  2. Mildly elevated CPK/rhabdomyolysis.  3. Generalized anxiety disorder.   HOSPITAL PROCEDURES:  1. MRI brain done on May 24, 2008, impression was reading:      a.     No acute intracranial abnormality.      b.     Advanced atrophy and chronic white matter change.  Those are       nonspecific, but likely reflect the sequelae of chronic       microvascular ischemia.  2. Chronic left sphenoid sinus disease.  3. Tiny scalp hematoma without significant intracranial disease.  4. X-ray for left shoulder was showing no definitely acute finding.      Chronic degenerative joint disease of the glenohumeral joint.      Complete rotator cuff tear, probably chronic.  Degenerative disease      and laxity in the [facility] joint, probably chronic.  5. Chest x-ray on May 23, 2008, was showing lungs are hyperinflated      with change of COPD.  There is no infiltrate or effusion.  No      thoracic spine fracture is identified.  The lungs are clear without      infiltrate or effusion.  6. X-ray for the right elbow, which is negative for fracture or      dislocation.  7. X-ray of lumbar spine on May 23, 2008, the day of admission,      showing dextrorotatory scoliosis with prominent degenerative      change.  No fracture.  8. CT head without contrast on the day of admission, May 23, 2008,      impression is:      a.     A small vertex scalp hematoma without  associated fracture.      b.     Progression of the generalized volume loss and advanced       small vessel ischemic disease since 2004.  No acute intracranial       abnormalities.  9. Carotid duplex is negative as per the preliminary report documented      in the paper chart.   DISCHARGE/PLAN:  The patient is discharged today to follow up with Dr.  Doristine Counter within a week.  It is recommended to follow through with EEG for  completion of workup of near syncope.  The patient was started on Zocor  with the recommendation of renal function and cholesterol monitoring.  Hypertension will be followed by home visiting nurse.      Beckey Rutter, MD  Electronically Signed     EME/MEDQ  D:  05/25/2008  T:  05/26/2008  Job:  [id number]   cc:   Marjory Lies, M.D.

## 2011-04-30 NOTE — H&P (Signed)
NAME:  Tamara [name], Tamara [name]                  ACCOUNT NO.:  [id number]   MEDICAL RECORD NO.:  [id number]          PATIENT TYPE:  INP   LOCATION:  1823                         FACILITY:  [facility]   PHYSICIAN:  Eduard Clos, MDDATE OF BIRTH:  30-Nov-1920   DATE OF ADMISSION:  05/23/2008  DATE OF DISCHARGE:                              HISTORY & PHYSICAL   History obtained from patient and patient's daughter.   CHIEF COMPLAINT:  The patient had a fall.   HISTORY OF PRESENT ILLNESS:  An 77 year old female with known history of  hypertension, dementia, chronic pain, arthritis, was brought into the ER  with the patient's family, who found the patient to be on the floor.  The patient had symptoms of nausea, vomiting, and diarrhea, 2 days ago  when she was feeling very tried and dehydrated, but yesterday her  nausea, vomiting, and diarrhea completely resolved.  In the morning,  early, she tried to go to the bathroom, subsequent to which she fell on  the floor and called her family members.  Her family members were unable  to see her.  She was found on the floor and she had generalized weakness  with no focal deficit.  The patient did not lose consciousness, had some  mild headache, after fall, on donepezil.  She also bruised her right  elbow.  Because she was having generalized weakness, fatigue, she was  brought to the ER for further workup.  The patient denies any chest  pain, shortness of breath, abdominal pain.  Denies any nausea, vomiting  or diarrhea now.  Denies any fever or chills.  There is no restriction  of her joint movements now.  She does also complain of low back pain.  The pain is increased on raising her right leg up.   PAST MEDICAL HISTORY:  Hypertension, chronic pain, dementia.   PAST SURGICAL HISTORY:  Hysterectomy, bilateral knee replacement.   MEDICATIONS:  1. Atenolol 50 mg p.o. daily.  2. Triamterene/hydrochlorothiazide 75/50 daily.  3. Megestrol 40 mg p.o. daily.  4.  Temazepam 15 mg as needed.  5. Xanax 0.5 mg as needed.  6. Hydrochlorothiazide/acetaminophen 5/500 mg as needed.  7. Aspirin 81 mg daily.  8. Centrum Silver 1 tablet daily.  9. Aricept 10 mg daily.   ALLERGIES:  MORPHINE.   FAMILY HISTORY:  Noncontributory.   SOCIAL HISTORY:  The patient lives alone.  Denies smoking cigarettes,  drinking alcohol, using illegal drugs.   REVIEW OF SYSTEMS:  As in history of present illness.  Nothing else  seen.   PHYSICAL EXAMINATION:  The patient was examined, not in acute distress.  VITAL SIGNS:  Blood pressure is 143/67.  Pulse 67 per minute.  Temperature 97.5.  Respirations 16 per minute.  O2 sat 99% on room air.  HEENT:  Anicteric, no pallor.  There is no obvious external injury on  the head.  PERRLA posture.  CHEST:  Bilateral air entry present.  No rhonchi on auscultation.  HEART:  S1 and S2 heard.  ABDOMEN:  Soft, nontender.  Bowel sounds heard.  No guarding, no  rigidity.  CNS:  The patient is alert, oriented to time, place, and person.  Moves  extremities.  Strength 5/5.  EXTREMITIES:  There is a bruise on the medial aspect of the right elbow.  There is a scratch injury on the left anterior shin of the lower leg.  The patient has no restriction of movement of her joints.  Peripheral  pulses heard, no edema.   LABS:  CBC, WBC is 14.9.  Hemoglobin 13.2.  Hematocrit 38.5.  Platelets  211.  Neutrophils 81%.  CMP, sodium 139, potassium 3.2, chloride 103,  carbon dioxide 29, glucose 99, BUN 15, creatinine 0.96.  AST 48, ALT 29,  calcium 9.5.  CPK 890, MB is 13.  Index is 1.5.   ASSESSMENT:  1. Status post fall, probably from near syncope from dehydration.  2. Hypertension.  3. Mildly elevated CPK.  4. Chronic pain.  5. General anxiety disorder.   PLAN:  The patient on telemetry.  Will start the patient on IV fluids.  CT of the head, chest x-ray, EKG, follow cardiac markers.  Hold  triamterine/hydrochlorothiazide for now.  Will check  orthostatic blood  pressures.  Further recommendation as the patient's condition evolves.      Eduard Clos, MD  Electronically Signed     ANK/MEDQ  D:  05/23/2008  T:  05/23/2008  Job:  [id number]

## 2011-05-03 NOTE — Op Note (Signed)
   NAME:  Tamara [name], STRZELECKI                            ACCOUNT NO.:  [id number]   MEDICAL RECORD NO.:  [id number]                   PATIENT TYPE:  AMB   LOCATION:  DAY                                  FACILITY:  [facility]   PHYSICIAN:  [name], M.D.             DATE OF BIRTH:  [birth year]   DATE OF PROCEDURE:  11/04/2002  DATE OF DISCHARGE:                                 OPERATIVE REPORT   SURGEON:  Georges Lynch. Darrelyn Hillock, M.D.   ASSISTANT:  Ebbie Ridge. Paitsel, P.A.   PREOPERATIVE DIAGNOSIS:  Complete tear of the rotator cuff tendon on the  left.   POSTOPERATIVE DIAGNOSIS:  Complete tear of the rotator cuff tendon on the  left.   OPERATIONS:  1. Partial acromionectomy and acromioplasty on the left shoulder.  2. Repair of the rotator cuff tendon tear utilizing the graft-jacket.   PROCEDURE:  Under general anesthesia, prior to taking the patient back with  general anesthesia, an interscalene block.  At this time, an incision was  made over the anterior aspect of the left shoulder.  She had 1 gram of IV  Ancef preop.  Bleeders identified and cauterized.  I dissected the deltoid  tendons from the acromion in the usual fashion and I split the proximal  portion of the deltoid muscle.  At this time, I noted she had severe  impingement syndrome.  I utilized the Ship broker to protect the  underlying remaining tendon and I did a partial acromionectomy and  acromioplasty utilizing the oscillating saw and the bur.  Following this, I  then did a longitudinal repair of the proximal portion of the tendon and  then I incorporated the graft-jacket into the tendon itself, no anchors like  that were necessary.  Prior to doing the repair though, I did bur the  lateral articular surface of the humerus down to bleeding bone for purposes  of regrowth into the graft-jacket.  At this time, we had a nice repair  utilizing the graft-jacket of the tendon graft.  I then went ahead and  closed the deltoid  tendon with usage of the remaining portion of the graft-  jacket.  The main part of the muscle was closed with 0 Vicryl.  The  subcutaneous was closed with 0 Vicryl.  The skin with metal staples.  Sterile Neosporin dressing was applied and she was placed in a shoulder  immobilizer.                                               [name], M.D.    RAG/MEDQ  D:  11/04/2002  T:  11/04/2002  Job:  [id number]

## 2011-05-03 NOTE — Op Note (Signed)
NAME:  Tamara [name], Tamara [name]                            ACCOUNT NO.:  [id number]   MEDICAL RECORD NO.:  [id number]                   PATIENT TYPE:  INP   LOCATION:  0002                                 [facility]ACILITY[facility]   PHYSICIAN:  Georges Lynch. Gioffre, M.D.             DATE OF BIRTH:  02/27/1920   DATE OF PROCEDURE:  06/13/2004  DATE OF DISCHARGE:                                 OPERATIVE REPORT   PREOPERATIVE DIAGNOSES:  1. Severe degenerative arthritis of the right knee.  2. Skin lesion anterior tibial region.   POSTOPERATIVE DIAGNOSES:  1. Severe degenerative arthritis of the right knee.  2. Skin lesion anterior tibial region.   SURGEON:  Georges Lynch. Darrelyn Hillock, M.D.   ASSISTANT:  Ebbie Ridge. Paitsel, P.A.   OPERATION:  Excisional biopsy of a skin lesion right proximal tibia. Right  total knee arthroplasty. We utilized the [employer] system. The sizes used  was a size 26 patella, a size right posterior cruciate stabilizing type  femoral component size 5.  The patella was a size 26 as I mentioned.  The  tibia was a size 5 tray with a 12 mm thickness flex insert.  All three  components were cemented and vancomycin was used in the cement.   DESCRIPTION OF PROCEDURE:  Under spinal anesthesia, routine orthopedic prep  and draping of the right lower extremity was carried out. The patient had 1  g of IV Ancef.  An incision was made over the anterior aspect of the right  knee after the leg was exsanguinated and the tourniquet was elevated at 325  mmHg.  Two flaps were created and sutured in place.  I then carried out a  median parapatellar incision, patella was reflected laterally. I flexed the  knee and did medial and lateral meniscectomies. I also excised the anterior  and posterior cruciate ligaments. We then removed all the spurs up in the  tibia and femur and patella. I then made my initial drill hole in the  intercondylar notch and the #1 jig was inserted and we removed 10 mm  thickness off the  distal femur.  Following this, we then removed 4 mm  thickness off the tibia and we utilized the 5 degree posterior slope guide.  After this was done, then we went back and prepared the femur. We did our  anterior and posterior chamfering cuts for a size 5 femur. We then cut our  notch cut out with the patella notch cut instrument and then our  intercondylar notch cut was made. Following this, we then went on and  inserted our trial prostheses, went through range of motion and had  excellent stability with a 12 mm thickness. We tried first a 10 mm thickness  insert and we felt that the 12 was much more stable.  We then cut our  patella, removed 10 mm thickness off the articular surface of the patella,  three drill holes were made in the patella in the usual fashion.  I then cut  my keel cut out of the proximal tibia metaphysis. We then thoroughly water  picked out the knee, dried the knee out, cemented all three components in  simultaneously. We made sure there were no loose pieces of cement remaining.  We then went through our trials once again and then finally decided to use  the permanent polyethylene size 5, 12 mm thickness flex type insert.  We had  good stability with the knee with this  prosthesis at this time. We thoroughly irrigated the knee out, inserted a  Hemovac drain and closed the wound in layers in the usual fashion. After  this, we then did an excisional biopsy of this skin lesion in the proximal  tibial region.  We went that to the lab.                                               [name], M.D.    RAG/MEDQ  D:  06/13/2004  T:  06/13/2004  Job:  [id number]

## 2011-05-03 NOTE — Op Note (Signed)
NAME:  Tamara [name], Tamara [name]                  ACCOUNT NO.:  [id number]   MEDICAL RECORD NO.:  [id number]          PATIENT TYPE:  INP   LOCATION:  0001                         FACILITY:  [facility]   PHYSICIAN:  Georges Lynch. Gioffre, M.D.DATE OF BIRTH:  1920-02-22   DATE OF PROCEDURE:  06/12/2005  DATE OF DISCHARGE:                                 OPERATIVE REPORT   SURGEON:  Georges Lynch. Darrelyn Hillock, M.D.   ASSISTANT:  Ebbie Ridge. Paitsel, P.A.   PREOPERATIVE DIAGNOSIS:  Severe degenerative arthritis of the left knee.   POSTOPERATIVE DIAGNOSIS:  Severe degenerative arthritis of the left knee.   OPERATION:  DePuy cemented left total knee arthroplasty.  I used vancomycin  in the cement.  The size used was a 2.5 mm femoral component, posterior  cruciate, sacrificing type.  The tibial tray was a 2.5 mm, the tibial insert  was a 2.5 mm, a 17.5 mm thickness insert.  The patella was a size 22,  resurfacing-type patella.  We did use vancomycin in the cement.   DESCRIPTION OF PROCEDURE:  Under spinal anesthesia, a routine orthopedic  prep and draping of the left lower extremity is carried out.  The leg was  exsanguinated with an Esmarch.  The tourniquet was elevated at 350 mmHg.  The incision was made over the anterior aspect of the left knee.  Bleeders  were identified and cauterized.  Two flaps were created.  Following this, I  then carried out a median para-patellar incision.  I reflected the patella  laterally and flexed the knee and did a synovectomy.  She had a severe  synovitis.  Following this, we then did medial and lateral meniscectomies  and excised the anterior and posterior cruciate ligaments.  I then made my  initial drill hole in the inner condylar notch and inserted a #1 jig and  removed 10 mm thickness off the distal femur.  The #2 jig then was inserted  for a size 2.5.  We made the appropriate measurements and made our  appropriate drill holes in the femur, and then the third jig was inserted  for  our cut and made the appropriate cuts for the anterior, posterior and  chamfer cuts for 2.5 mm thickness femur.  We then went on and prepared a  tibia in the usual fashion.  We utilized a size 2.5 tray.  A drill hole was  made in the tibial plateau and we then went down and inserted the canal  finder and the intramedullary rod was inserted and a 4 mm thickness was  taken off of the effected medial side.  Following that we then continued to  prepare the tibia and cut the keel cut out of the tibia metaphysis.  Once  the tibia was prepared, we then went on to cut our notch cut out of the  distal femur.  We went to trials in the usual fashion and selected a size  2.5 mm femur, left, a 2.5 mm tibial tray with a 17.5 mm thickness tibial  insert.  We then removed the appropriate amount of bone from the  patella  with a re-surfacing-type cut, and we measured for  a 32 mm patella.  Three  drill holes were made in the patella.  We then thoroughly water-picked out  the knee, and cemented all three components in simultaneously.  The loose  pieces of cement were removed in the usual fashion.  We went through a trial  first with a 15 mm thickness tibia insert, and then finally decided to  remove that and insert the permanent 17.5 mm thickness tibial insert.  We  had excellent stability, excellent flexion and extension.  Following that we  thoroughly  water-picked out the knee and made sure that there were no other loose  pieces of cement.  There were not.  We then inserted a Hemovac drain and  closed the wound in layers in the usual fashion.  A sterile Neosporin  dressing was applied.  She had 1 gram of IV Ancef preoperatively.       RAG/MEDQ  D:  06/12/2005  T:  06/12/2005  Job:  [id number]

## 2011-05-03 NOTE — H&P (Signed)
NAME:  Tamara [name], Tamara [name]                  ACCOUNT NO.:  [id number]   MEDICAL RECORD NO.:  [id number]          PATIENT TYPE:  ORB   LOCATION:                               FACILITY:  [facility]   PHYSICIAN:  Ranelle Oyster, M.D.DATE OF BIRTH:  15-Dec-1920   DATE OF ADMISSION:  06/15/2005  DATE OF DISCHARGE:                                HISTORY & PHYSICAL   CHIEF COMPLAINT:  Left knee pain.   HISTORY OF PRESENT ILLNESS:  This is a 77 year old white female with a  history of a right total knee replacement admitted last year to  rehabilitation after her initial surgery, who came in on June 12, 2005 with  end-stage changes in the left knee.  The patient underwent a left total knee  replacement on June 12, 2005 by Dr. Darrelyn Hillock.  The patient was placed on  Coumadin for DVT prophylaxis.  She is weightbearing as tolerated on the left  lower extremity.  The patient has had no significant postoperative  complications.  As of June 14, 2005, the patient was a minimum assist for  bed mobility, minimum assist for transfer, minimum assist 60 feet with a  rolling walker.  The patient needs to achieve modified independent goals.  Thus, it was determined that we would bring her to subacute level  rehabilitation to reach set goals.   REVIEW OF SYSTEMS:  The patient denied any systemic, ENT, ocular,  respiratory, cardiovascular, GI, GU, skin, mental status, neurological,  psychiatric problems.  Full review of systems is in the written H&P.   PAST MEDICAL HISTORY:  1.  Positive for hypertension.  2.  Anxiety.  3.  D&C.  4.  TAH.  5.  Rotator cuff repair on the left in 2005.   SOCIAL HISTORY:  The patient denies alcohol or tobacco use.  The patient has  3 steps to enter her home.  She has a one-level home.  Her husband lives  there with her, but is legally blind; otherwise, physically fit.  The  patient was independent prior to arrival.  Local family works.   FAMILY HISTORY:  Noncontributory.   MEDICATIONS PRIOR TO ARRIVAL:  1.  Atenolol.  2.  Maxzide.  3.  Xanax.   ALLERGIES:  Morphine sulfate.   PHYSICAL EXAMINATION:  VITAL SIGNS:  On exam today, her blood pressure was  156/70, pulse 77, respiratory rate 18.  GENERAL:  The patient is alert and oriented x3, in no apparent distress.  HEENT:  Pupils equal, round and reactive to light and accommodation.  Extraocular eye movements were intact.  Ear, nose, and throat exam was  unremarkable.  NECK:  Supple without JVD or lymphadenopathy.  CHEST:  Clear to auscultation bilaterally.  HEART:  Regular rate and rhythm without murmurs, rubs, or gallops.  ABDOMEN:  Soft, nontender.  SKIN:  Intact, less the area at the left knee.  The left knee would was  clean and intact with mild serosanguineous discharge.  The wound was well  approximated.  The area was tender.  EXTREMITIES:  The patient had trace edema of the left  leg and right lower  extremity, as well.  Pulses were 2+.  Skin was intact.  NEUROLOGIC:  Cranial nerves II-XII were normal.  Deep tendon reflexes were  2+.  Sensation was normal.  The patient had good judgment, orientation,  memory, and affect.  She may have been slightly hard of hearing.  The  patient had 5/5 strength in the upper extremities.  The right lower  extremity was 4 to 4+/5.  The left lower extremity examination 2+/5 at the  hip, 1+ and at the knee, and 3+ to 4/5 at the ankle.   ASSESSMENT AND PLAN:  1.  Functional deficit secondary to osteoarthritis of the left knee, status      post left total knee replacement, postoperative day #3.  Begin subacute      level rehabilitation with modified independent goals.  Estimated length      of stay is 5-7 days.  Prognosis is excellent.  2.  Deep vein thrombosis prophylaxis with Lovenox to Coumadin.  3.  Pain management with p.r.n. Robaxin and Demerol.  Consider a switch to      Percocet.  4.  Postoperative anemia.  Monitor serial hemoglobins and hematocrits.       Continue iron daily.  5.  Hypertension.  Continue Tenormin and Maxzide.  6.  Anxiety.  Continue Xanax at bedtime.       ZTS/MEDQ  D:  06/14/2005  T:  06/14/2005  Job:  [id number]

## 2011-05-03 NOTE — Discharge Summary (Signed)
NAMEVEDIKA, DUMLAO                  ACCOUNT NO.:  [id number]   MEDICAL RECORD NO.:  [id number]          PATIENT TYPE:  ORB   LOCATION:  4505                         FACILITY:  [facility]   PHYSICIAN:  Ellwood Dense, M.D.   DATE OF BIRTH:  Dec 08, 1920   DATE OF ADMISSION:  06/15/2005  DATE OF DISCHARGE:  06/19/2005                                 DISCHARGE SUMMARY   DISCHARGE DIAGNOSES:  1.  Left total knee arthroplasty secondary to degenerative joint disease,      June 28.  2.  Pain management.  3.  Coumadin for deep vein thrombosis prophylaxis.  4.  Postoperative anemia.  5.  Hypertension.  6.  Anxiety.  7.  History of a right total knee arthroplasty in June 2005.   HISTORY OF PRESENT ILLNESS:  A 77 year old white female with a history of  right total knee arthroplasty with inpatient rehab services from July 4 to  June 25, 2004, now admitted June 12, 2005, with end-stage changes of the  left knee and no relief with conservative care.  Underwent a left total knee  arthroplasty on June 28 per Dr. Darrelyn Hillock.  Placed on Coumadin for deep vein  thrombosis prophylaxis.  Weightbearing as tolerated.  Postoperative anemia  of 9.3 and monitored.  Admitted to subacute care services.   PAST MEDICAL HISTORY:  See discharge diagnoses.   No alcohol.  No tobacco.   Allergies are MORPHINE.   SOCIAL HISTORY:  Lives with husband in [place].  Husband is legally  blind.  Local family works.  One-level home, three steps to entry.   MEDICATIONS PRIOR TO ADMISSION:  Atenolol, Maxzide, and Xanax.   HOSPITAL COURSE:  The patient with progressive gains while on rehab services  with therapies initiated daily.  The following issues are followed during  the patient's rehab course.  Pertaining to Ms. Olafson's left total knee  arthroplasty, surgical site healing nicely, ambulating extended distances  with a walker, weightbearing as tolerated.  She would follow up with Dr.  Darrelyn Hillock for removal of staples.   She remained on Coumadin for deep vein  thrombosis prophylaxis with latest INR of 2.1 to be completed by [facility].  Postoperative anemia stable at 9.5.  Hematocrit of  27.6.  She remained on iron supplement.  She had some mild hyponatremia of  128.  Her Maxzide was held with follow-up labs pending.  She had no  orthostatic changes; no headache; no dizziness throughout her rehab course.  Progressive gains in all areas and functional aspects as she was ambulating  throughout the rehab unit.  Home health therapies have been arranged as  noted above.   DISCHARGE MEDICATIONS AT TIME OF DICTATION:  1.  Coumadin 2 mg daily to be completed July 12, 2005.  2.  Trinsicon capsule twice daily.  3.  Maxzide, on hold until follow-up labs.  4.  Xanax 0.5 mg at bedtime.  5.  Tenormin 50 mg at bedtime.  6.  Oxycodone as needed for pain.   ACTIVITY:  As tolerated.   DIET:  Regular.  SPECIAL INSTRUCTIONS:  Home health nurse per Genevieve Norlander to complete Coumadin  protocol.  She is to follow up with Dr. Marjory Lies of [facility] for medical management.      Danie   DA/MEDQ  D:  06/18/2005  T:  06/18/2005  Job:  [id number]   cc:   Windy Fast A. Darrelyn Hillock, M.D.  [facility]  [address]  [address]  [place]  [place] [address]  Fax: [phone]   Dr. Revonda Standard [facility]

## 2011-05-03 NOTE — Discharge Summary (Signed)
NAME:  Tamara [name], Tamara [name]                            ACCOUNT NO.:  [id number]   MEDICAL RECORD NO.:  [id number]                   PATIENT TYPE:  ORB   LOCATION:  4533                                 FACILITY:  [facility]   PHYSICIAN:  Ellwood Dense, M.D.                DATE OF BIRTH:  09/05/1920   DATE OF ADMISSION:  06/18/2004  DATE OF DISCHARGE:  06/25/2004                                 DISCHARGE SUMMARY   DISCHARGE DIAGNOSES:  1. Right total knee replacement.  2. Postoperative anemia.  3. Hypertension.   HISTORY OF PRESENT ILLNESS:  Ms. Tamara [name] is an 77 year old female with a  history of hypertension, right knee pain, secondary to end-stage OA and no  relief with conservative therapy. Elected to undergo right total knee  replacement June 29 by Dr. Darrelyn Hillock. Postoperative is weight bearing as  tolerated on Coumadin and heparin for DVT prophylaxis. __________ secondary  to drop in hemoglobin. She has had some problems with dizziness that are  resolving. Currently, she is at supervision for bed mobility and supervision  for ambulating 70 feet with rolling walker, min assist for transfers,  requires set up assist for upper body care, mod to max assist for low body  care. [facility] consulted for progressive therapy.   PAST MEDICAL HISTORY:  1. Left rotator cuff repair.  2. TAH/BSO.  3. Right knee scope.  4. Hypertension.  5. Insomnia.  6. Poison ivy recently.   FAMILY HISTORY:  Positive for lung cancer.   SOCIAL HISTORY:  The patient lives in two-level home with three steps to  entry. Was independent prior to admission. She does not use any tobacco or  alcohol. Husband is legally blind and has to have assist at home.   HOSPITAL COURSE:  Ms. Tamara [name] was admitted to the [facility] on June 18, 2004  for inpatient therapies to consist of PT/OT daily. The past admission, she  was maintained on Coumadin for DVT prophylaxis. Followup labs to check on  postoperative anemia showed this to be  resolving nicely with hemoglobin  11.9, hematocrit 34.1. Check of electrolytes showed patient to hypernatremic  with sodium at 128, potassium 4.1, chloride 91, CO2 29, BUN 21, creatinine  0.9, glucose 102. Question whether this lab value was accurate with recheck  of labs on July 8 showing sodium at 130, potassium 4.4, chloride 94, CO2 30,  BUN 17, and creatinine 0.8. The patient has had no symptoms secondary to her  hyponatremia. Her knee incision has been healing well without any signs or  symptoms of infection. No drainage or erythema noted. She did report some  left foot pain, and __________ trial was initiated to help with gout-type  symptoms. During her stay in [place], Ms. Tamara [name] progressed along well. She was  at modified independent level for transfers, modified independent level for  ambulating greater than 20 feet with rolling walker. She required  supervision for car transfers. This patient was modified independent for  upper and lower body care including toileting. Further followup therapies to  include home health PT/OT by [facility]. The patient to have  proteins drawn at Dr. Mellody Life office next on June 26, 2004. Dr. Doristine Counter  has graciously agreed to follow patient's Coumadin and adjust dosage for the  next two weeks. On June 27, 2004, the patient is discharged to home.   DISCHARGE MEDICATIONS:  1. Coumadin 2 mg p.o. q.p.m.  2. Tenormin 50 mg a day.  3. Robaxin 500 mg q.i.d. for spasms.  4. Trinsicon 1 p.o. per day.  5. Dyazide 1 p.o. per day.  6. Oxycodone IR 5 to 10 mg q.4-6h. p.r.n. pain.   ACTIVITY:  Use walker. Use CPM machine two to four hours at home.   DIET:  Regular.   WOUND CARE:  Keep area clean and dry and wash in soap and water.   SPECIAL INSTRUCTIONS:  Home health PT/OT by [facility]. No aspirin  or aspirin products while on Coumadin. No alcohol, smoking, or driving.      Greg Cutter, P.A.                    Ellwood Dense,  M.D.    PP/MEDQ  D:  06/27/2004  T:  06/28/2004  Job:  [id number]   cc:   Marjory Lies, M.D.  [address]  [place]  [place] [address]  Fax: [phone]   Georges Lynch. Darrelyn Hillock, M.D.  [facility]  [address]  [address]  [place]  [place] [address]  Fax: [phone]

## 2011-05-03 NOTE — Discharge Summary (Signed)
NAME:  Tamara [name], Tamara [name]                            ACCOUNT NO.:  [id number]   MEDICAL RECORD NO.:  [id number]                   PATIENT TYPE:  INP   LOCATION:  0449                                 FACILITY:  [facility]   PHYSICIAN:  Pershing Cox, M.D.            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   CONDITION ON DISCHARGE:  Stable and improved.   PROCEDURE:  Exploratory laparotomy, TAH, BSO, lysis of adhesions.   HOSPITAL COURSE:  Tamara [name] was admitted on the day of surgery and taken to  the operating room where under general anesthesia a total abdominal  hysterectomy, bilateral salpingo-oophorectomy, and extensive lysis of  adhesions was performed because of adhesive disease of the rectosigmoid to  the cul-de-sac into the right adnexa.  Her blood loss was 225.  There were  no intraoperative complications.   On the evening of the patient's date of admission, she was alert and  conversant and had no significant concerns.  Her pain was well controlled.  She was able to sip water.  On postoperative day #1, the patient's pain was  still well controlled, but she had two significant episodes of nausea and  was unable to tolerate liquids.  She was able to take sips of clear liquids  that day and was converted to Toradol for pain relief.  Her urine output was  quite sluggish and she had been on hydrochlorothiazide for some time for  blood pressure control, therefore, she was treated with a single dose of 10  mg of Lasix.  The patient was able to void, but had a large residual.  Foley  catheter was reinserted on the evening of postoperative day #1.  On the  morning of postoperative day #2, the patient's only complaint was that she  was unable to sleep through the night.  She has used Xanax for a long time  to sleep and she had not received any p.o. Xanax in preparation for sleep.  She had flatus that morning.  She was able to just tolerate sips without  difficulty, and was converted to a clear liquid diet.  Over the next 24  hours, she began to diurese significantly.  She had laboratory concerns with  a potassium of 3.2 and a sodium of 128.  She received normal saline boluses  with 10 mg of potassium.  There was some burning with this, but she was able  to tolerate it.  She was given Xanax for sleep, and slept really well.  On  the morning of postoperative day #3, she had no nausea, no stomach pain, had  a bowel movement, and was tolerating p.o.  She was  able to void, and a  decision was made to allow her to be discharged to home.   The patient's family has filled prescriptions for Darvocet prior to her  admission.  She was instructed to take either Darvocet or Tylenol every  three to four hours over the next 48 hours for pain control.  She will be  seen in my office on Thursday, for removal of her staples which were in the  midline incision.  She will be contacted as soon as the pathology is  available.   DISCHARGE INSTRUCTIONS:  1. Napping after returning home.  2. Beginning on a regular diet.  3. She is instructed to walk at least three times a day and after each walk     to take a nap.  4. She will contact me if she has any problems which include temperature     over 100, dysuria, or leakage from her incision.                                               Pershing Cox, M.D.    MAJ/MEDQ  D:  11/27/2003  T:  11/27/2003  Job:  [id number]

## 2011-05-03 NOTE — H&P (Signed)
NAME:  BRENNYN, ORTLIEB                            ACCOUNT NO.:  [id number]   MEDICAL RECORD NO.:  [id number]                   PATIENT TYPE:  INP   LOCATION:  NA                                   FACILITY:  [facility]   PHYSICIAN:  Georges Lynch. Darrelyn Hillock, M.D.             DATE OF BIRTH:  02-27-1920   DATE OF ADMISSION:  07/13/2004  DATE OF DISCHARGE:                                HISTORY & PHYSICAL   CHIEF COMPLAINT:  Right knee pain.   HISTORY OF PRESENT ILLNESS:  The patient is an 77 year old female with a  longstanding history of right knee pain for which she has been followed  closely by Dr. Darrelyn Hillock.  She has previously undergone conservative  management including and all the way up to a right knee scope.  Her symptoms  have not improved at this point and she continues to have pain in her right  knee.  Radiographs in the office revealed a complete collapse of the lateral  joint space in the right knee.  Upon these findings, Dr. Darrelyn Hillock felt it was  best to proceed with a right total knee arthroplasty.  The patient agrees.  The risks and benefits of the surgery were discussed with the patient and  the patient wishes to proceed.   PAST MEDICAL HISTORY:  Hypertension.   PAST SURGICAL HISTORY:  Rotator cuff repair, D&C, hysterectomy, and right  knee scope.   MEDICATIONS:  1. Atenolol 50 mg one p.o. daily.  2. Maxzide 75/50 mg one p.o. daily.  3. Xanax 5 mg two p.o. q.h.s.   The patient has no known drug allergies; however, she does have an  INTOLERANCE to MORPHINE.  It seems to make her hallucinate and loss of  memory.   SOCIAL HISTORY:  The patient denies any tobacco or alcohol use.  She is  married.  She lives in a two-story house with 3 steps entering the house.   FAMILY HISTORY:  Mother with lung cancer.   REVIEW OF SYSTEMS:  GENERAL:  Denies fever, chills, night sweats bleeding  tendencies.  CENTRAL NERVOUS SYSTEM:  Denies blurry or double vision,  seizures, headaches,  paralysis.  RESPIRATORY:  Denies shortness of breath,  productive cough, hemoptysis.  CARDIOVASCULAR:  Denies chest pain, angina,  orthopnea.  GI:  Positive constipation, Denies nausea, vomiting, diarrhea,  melena, bloody stools.  GU:  Denies dysuria, hematuria, or discharge.  MUSCULOSKELETAL:  Pertinent as in the HPI.   PHYSICAL EXAMINATION:  VITAL SIGNS:  Blood pressure 136/78, pulse 60,  respirations 12.  GENERAL:  A well-developed, well-nourished 77 year old female.  HEENT:  Normocephalic, atraumatic.  Pupils equal, round, reactive to light.  NECK:  Supple.  No carotid bruit noted.  CHEST:  Clear to auscultation bilaterally.  No wheezes or crackles.  HEART:  Regular rate and rhythm.  No murmurs, rubs, or gallops.  ABDOMEN:  Soft, nontender, nondistended.  Positive bowel sounds  x4.  EXTREMITIES:  The patient has a severe valgus deformity to the right knee.  She has increased pain with range of motion.  She is neurovascularly intact  distally.  SKIN:  No rashes or lesion.   Radiographs reveal end-stage osteoarthritis of the right knee.   IMPRESSION:  1. Osteoarthritis of the right knee.  2. Hypertension.   PLAN:  The patient will be admitted to the [facility] on June 13, 2004 and undergo a right total knee arthroplasty by Dr. Ranee Gosselin.  Also of note, the patient is hoping possibly for a rehab stent due to the  fact that in her social situation her husband is blind and she is the sole  Yenesis Even for him.  She feels that a rehab stent would be best for her to get  most of her strength back prior to going home to taking care of her husband  again, and she also wished to not be placed on IV MORPHINE.  It was  discussed as to possibly a low-dose Dilaudid for the patient to try to see  if this will cut down on her adverse side effects from MORPHINE.     Clarene Reamer, P.A.-C.                   [name], M.D.    SW/MEDQ  D:  06/06/2004  T:  06/06/2004   Job:  [id number]   cc:   Marjory Lies, M.D.  [address]  [place]  [place] [address]  Fax: [phone]

## 2011-05-03 NOTE — Op Note (Signed)
NAME:  Tamara, [name]                            ACCOUNT NO.:  [id number]   MEDICAL RECORD NO.:  [id number]                   PATIENT TYPE:  AMB   LOCATION:  DAY                                  FACILITY:  [facility]   PHYSICIAN:  Pershing Cox, M.D.            DATE OF BIRTH:  1920-04-29   DATE OF PROCEDURE:  09/30/2003  DATE OF DISCHARGE:                                 OPERATIVE REPORT   PREOPERATIVE DIAGNOSIS:  Heavy vaginal discharge and abnormal endometrium  consistent with an endometrial polyp.   POSTOPERATIVE DIAGNOSIS:  Abnormal endometrium consistent with uterine  neoplasm.   PROCEDURE:  1. Exam under anesthesia.  2. Fractional D&C.  3. Hysteroscopy with resection of endometrial polypoid tissue.   SURGEON:  Pershing Cox, M.D.   INDICATIONS FOR PROCEDURE:  Tamara [name] is an 77 year old female, who is self-  referred to my office because of persistent vaginal discharge.  On  examination a sonogram was performed, and this showed a thickened  endometrial stripe measuring 2 cm in diameter.  Because of this abnormality,  the decision was made to come to the operating room with plans to resect a  polyp if found.   OPERATIVE FINDINGS:  The patient's uterus is approximately 8-10 weeks in  size.  It is globular in shape.  There were no adnexal masses palpated.  The  patient's cavity sounded to 10 cm.  Hysteroscopic visualization showed frond-  like endometrium with abnormal vessels coursing throughout the surface of  the endometrium.  There was no discrete endometrial polyp, although  fragments of tissue were polypoid in nature.   DESCRIPTION OF PROCEDURE:  Tamara [name] was brought to the operating room with  an IV in place.  In the holding area, she had received 1 g of Ancef.  On the  OR table, IV sedation was administered, and then LMA was placed for the  administration of general endotracheal anesthesia.  She was placed into  Allen stirrups while awake so that her knee  could be carefully positioned.  After asleep, the patient's lower abdomen, perineum, and vagina were prepped  with a solution of Hibiclens.  A small catheter was used to drain the  bladder of urine, and exam under anesthesia was performed.  She was then  draped for a sterile vaginal procedure.  Collecting drape was placed beneath  her buttocks for measurement of the effluent.   Bivalve speculum was inserted into the vagina.  The cervix was visualized,  and 0.25% Marcaine was injected into the cervix to develop a paracervical  block.  A total of 10 mL were injected into the peristomal tissues at the 3,  4, 7, and 8 position.  Single-tooth tenaculum was used to grasp the cervix  at the 12 o'clock position.  Kevorkian curette was used to obtain  endocervical curettings, the uterine sound then passed deep into the  endometrial cavity to  a depth of 8 cm.  Serial Pratt dilators were used to  dilate the cervix to size 35 Pratt, and the resectoscope was used to  visualize the endometrial cavity.  using through-and-through sorbitol  irrigation, the cavity was visualized, and photographs were taken.  This was  a distinctly abnormal cavity and with this information, the resectoscope was  used to resect the visible fronds of tissue.  Then a sharp curette was used  to retrieve these curettings.  Once the cavity had been adequately curetted,  the resectoscope was reintroduced, and several more fronds of tissue were  resected.  The cavity could then be visualized as the normal endometrial  cavity.  There was abnormal tissue coating all of the walls  of the endometrium, even at the completion of this procedure.  There was no  evidence of perforation.  The uterine sound was passed again to verify this.  Photograph was taken at the end of the procedure.  There were no  complications from this procedure.  The patient was taken to the recovery  room in good condition.                                                Pershing Cox, M.D.    MAJ/MEDQ  D:  09/30/2003  T:  09/30/2003  Job:  [id number]   cc:   Alfonse Alpers. Dagoberto Ligas, M.D.  [address]  [place]  [place] [address]  Fax: [phone]

## 2011-05-03 NOTE — H&P (Signed)
NAME:  Tamara [name], Tamara [name]                  ACCOUNT NO.:  [id number]   MEDICAL RECORD NO.:  [id number]          PATIENT TYPE:   LOCATION:                                 FACILITY:   PHYSICIAN:  Georges Lynch. Gioffre, M.D.DATE OF BIRTH:  1920-09-22   DATE OF ADMISSION:  DATE OF DISCHARGE:                                HISTORY & PHYSICAL   HISTORY:  The patient has had left knee pain for several months.  The  patient had left knee arthroscopy in February, 2006.  She has continued to  have pain since that time despite the meniscectomy.  She was found to have  significant arthritis at the time of her knee arthroscopy, and she elected  to proceed with a left total knee arthroplasty.   ALLERGIES:  None.   PRIMARY CARE Dorwin Fitzhenry:  Dr. Revonda Standard, family practice.   PAST MEDICAL HISTORY:  Significant for degenerative arthritis and  hypertension.   CURRENT MEDICATIONS:  1.  She is on atenolol 50 mg daily.  2.  Dyazide 70/50 mg daily.  3.  Xanax 0.5 mg b.i.d. p.r.n.   PAST SURGICAL HISTORY:  The patient had a left knee arthroscopy in February,  2006.  She had shoulder surgery in November, 2003, right knee scope in 2005.   FAMILY HISTORY:  Significant for lung cancer.   REVIEW OF SYSTEMS:  GENERAL:  Denies weight change, fevers, chills or  fatigue.  HEENT:  Denies headache, visual changes, tinnitus, hearing loss,  sore throat.  CARDIOVASCULAR:  Denies chest pain, palpitations, shortness of  breath, orthopnea.  PULMONARY:  Denies dyspnea, wheezing, cough, sputum  production, hemoptysis.  GI:  Denies nausea, vomiting, hematemesis or  abdominal pain.  GU:  Denies dysuria, frequency, urgency, hematuria.  ENDOCRINE:  Denies polyuria, polydipsia, appetite change, heat or cold  intolerance.  MUSCULOSKELETAL:  The patient has left knee pain.  NEUROLOGIC:  Denies dizziness, vertigo, syncope, seizure.  SKIN:  Denies itching, rashes,  masses or moles.   PHYSICAL EXAMINATION:  VITAL SIGNS:   Temperature 97.9, pulse 68,  respirations 18, blood pressure 140/80, right arm, sitting.  GENERAL:  An 77 year old female in no acute distress.  HEENT:  PERRL.  EOMs are intact.  Pharynx clear.  NECK:  Supple without masses.  CHEST:  Clear to auscultation bilaterally.  No wheezing, rales or rhonchi  noted.  HEART:  Regular rate and rhythm without murmur.  ABDOMEN:  Positive bowel sounds.  Soft, nontender.  No organomegaly or  abnormal masses.  EXTREMITIES:  Examination of her left knee reveals painful range of motion.  SKIN:  Warm and dry.   An x-ray shows degenerative arthritis of the left knee.   IMPRESSION:  Degenerative arthritis of the left knee.   PLAN:  The patient is to be admitted to [facility] June 12, 2005  to undergo a left total knee arthroplasty.      Haynes Hoehn  D:  06/10/2005  T:  06/10/2005  Job:  [id number]

## 2011-05-03 NOTE — Discharge Summary (Signed)
NAME:  Tamara [name], Tamara [name]                            ACCOUNT NO.:  [id number]   MEDICAL RECORD NO.:  [id number]                   PATIENT TYPE:  INP   LOCATION:  0482                                 FACILITY:  [facility]   PHYSICIAN:  Georges Lynch. Darrelyn Hillock, M.D.             DATE OF BIRTH:  [birth year]   DATE OF ADMISSION:  06/13/2004  DATE OF DISCHARGE:  06/18/2004                                 DISCHARGE SUMMARY   ADMITTING DIAGNOSES:  1. Degenerative arthritis right knee.  2. Hypertension.   DISCHARGE DIAGNOSES:  1. Degenerative arthritis right knee status post right total knee     arthroplasty.  2. Hypertension.   PROCEDURE:  The patient was taken to the operating room on June 13, 2004.  She underwent a right total knee arthroplasty.  Surgeon:  Worthy Rancher, M.D.  Assistant:  Terie Purser, P.A.-C.  Surgery was performed under spinal  anesthesia.  A Hemovac drain was placed at the time of surgery.   CONSULTS:  1. PT.  2. OT.  3. Rehabilitation.   BRIEF HISTORY:  This patient is an 77 year old female with long-standing  history of right knee pain.  She has undergone conservative management  including right knee arthroscopy as well as nonsteroidal anti-inflammatory  medications.  Her symptoms have not improved and at this point she is having  a difficult time ambulating.  The patient was found to have severe collapse  of the lateral joint in her right knee.  The patient elected to proceed with  a right total knee arthroplasty after the benefits and risks were discussed  with Dr. Darrelyn Hillock.  Subsequently, the patient was admitted to the hospital  for same.   LABORATORY DATA:  Admission CBC:  WBC 7.8, RBC 3.97, hemoglobin 12.6,  hematocrit 36.0, platelet count 212.  Admission PT 12.4, INR 0.9, PTT 28.  Admission chemistries:  Sodium 138, potassium 3.5, chloride 100, CO2 28,  glucose 88, BUN 24, creatinine 0.8, calcium 9.5, total protein 6.6, albumin  3.9.  Pre admission urinalysis:  Specific  gravity 1.014, moderate leukocyte  esterase.  Microscopic examination revealed 0-2 wbc's per high powered  field.  The patient's blood type is O+.  Negative antibody screen.  Preoperative x-ray of right knee revealed moderate to severe degenerative  changes in the right knee.  Postoperative x-ray of right knee revealed right  total knee arthroplasty in good alignment.  I am unable to locate the  preoperative EKG or the chest x-ray in the patient's medical record.  The  patient's hemoglobin and hematocrit were followed throughout her  hospitalization.  She had a postoperative drop in her hemoglobin.  On  postoperative day three the patient was subsequently given 2 units of packed  red cells.  The patient's hemoglobin was monitored closely.  Hemoglobin  improved following transfusion to 11.1.   HOSPITAL COURSE:  The patient was admitted to [facility].  She  was  taken to the operating room.  She underwent the above stated procedure  without complications.  She tolerated the procedure well and was allowed to  return to the recovery room and then to the orthopedic floor to continue  postoperative care.  Hemovac drain was placed at the time of surgery.  That  was be discontinued on postoperative day one.  The patient was placed on PC  analgesia for pain control.  The patient's pain was well controlled and she  was weaned over to oral analgesics by postoperative day three.  The PCA was  discontinued.  PT, OT, and rehabilitation were consulted.  Due to the  patient's social situation with her husband being blind and she is the sole  Spencer Cardinal, she needs to be as independent as possible before she goes home.  The patient was evaluated by rehabilitation and was felt to be a good  candidate.  A bed became available on [facility].  The patient was stable, met  criteria, and was discharged to [facility] bed on June 18, 2004.   TRANSFER MEDICATIONS:  1. Trinsicon one tablet b.i.d.  2. Coumadin per  pharmacy protocol.  3. Percocet 10/650 one or two q.6h. as needed for pain.  4. Robaxin 500 mg as needed for muscle spasm.  5. Atenolol 50 mg daily.  6. Maxzide 75/50 mg daily.  7. Xanax 0.5 mg h.s. p.r.n.   DIET:  As tolerated.   ACTIVITY:  Full weightbearing with walker.   CONDITION AT THE TIME OF TRANSFER:  Improved.     Ebbie Ridge. Paitsel, P.A.                     [name], M.D.    Tilden Dome  D:  07/05/2004  T:  07/05/2004  Job:  [id number]

## 2011-05-03 NOTE — Assessment & Plan Note (Signed)
[facility]                         GASTROENTEROLOGY OFFICE NOTE   NAME:Tamara [name], Tamara [name]                         MRN:          [id number]  DATE:03/18/2007                            DOB:          February 06, 1920    [name] is a very nice 77 year old  white female who was brought by her  daughter for evaluation of three episodes of fecal incontinence which  occurred in the last three months.  They all occurred when she was  either in a cafeteria or working on the yard and bending over.  There  was no blood in the stools and no abdominal pain.  Her usual bowel  habits are normal formed bowel movement every morning.  She has had  regular eating habits, and her weight has been stable.  She has never  had a colonoscopy.  She was quite stressed out by these episodes, which  occurred rather suddenly without her actually knowing that she passed  stool.  She denies a history of any rectal surgery.  She had four  children.  There is no family history of colon cancer.  Her daughter who  is our patient had a colonoscopy and polypectomy.   MEDICATIONS:  1. Dyazide 75 mg p.o. daily.  2. Atenolol 5 mg p.o. daily.  3. Xanax 5 mg daily.  4. Temazepam 15 mg at bedtime.   PAST HISTORY:  Significant for:  1. High blood pressure.  2. Arthritis.   OPERATIONS:  1. Hysterectomy in 2006.  2. Appendectomy.  3. Both knee replaced in 2006.  4. Shoulder repair 2007.   FAMILY HISTORY:  Not significant.   SOCIAL HISTORY:  Widowed with three children .  She does not smoke, does  not drink alcohol.   REVIEW OF SYSTEMS:  Positive for allergies and arthritic joint pains.   PHYSICAL EXAMINATION:  Blood pressure 118/72.  Pulse 68.  Weighed 143  pounds.  She was hard of hearing, alert, oriented.  LUNGS:  Clear to auscultation.  COR:  Normal S1. Normal S2.  ABDOMEN:  Soft with normoactive bowel sounds, decreased muscle tone.  Well-healed surgical scars.  Liver edge at costal  margin.  No palpable  mass.  Rectal examination showed mildly decreased rectal tone.  Spacious  rectal ampulla with specks of formed stool which was hemoccult negative.   IMPRESSION:  An 77 year old white female with several episodes of  leakage of the stool with no associated abdominal pain or bleeding,  which may be result from several factors.  One would be decreased rectal  tone.  Another one would be possibility of rectocele, which usually  causes accumulation of the stool in the rectum.  There is also a  possibility that she has a narrow sigmoid colon due to diverticulosis,  which does not function properly and cannot hold much stool, which may  result in urgent bowel movements.  All these possibilities combined  could result in occasional incontinent stools.  Also, organic brain  syndrome related to age may result in decreased control of bowel  movements.   PLAN:  1. The patient's daughter  would like her to have a colonoscopy to make      sure there is no polyp, because she herself had a polyp.  We will      go ahead and schedule the patient for colonoscopy since she has      never had one.  2. Increase fiber in her diet.  Samples of Benefiber given to use once      a day.  This may bulk up the stool and improve the emptying of the      bowel.  3. I have asked the patient to try to have a bowel movement or      evacuate her rectum prior to going out to reduce the possibility of      leakage of the stool when she is away from home.  I assured her      that her symptoms are not indicative of any serious problem but      rather of inconvenience.     Hedwig Morton. Juanda Chance, MD  Electronically Signed    DMB/MedQ  DD: 03/18/2007  DT: 03/18/2007  Job #: [id number]   cc:   Tamara [name], M.D.

## 2011-05-03 NOTE — Discharge Summary (Signed)
NAME:  Tamara [name], SCHEXNIDER                  ACCOUNT NO.:  [id number]   MEDICAL RECORD NO.:  [id number]          PATIENT TYPE:  INP   LOCATION:  1521                         FACILITY:  [facility]   PHYSICIAN:  Georges Lynch. Gioffre, M.D.DATE OF BIRTH:  02-09-1920   DATE OF ADMISSION:  06/12/2005  DATE OF DISCHARGE:  06/15/2005                                 DISCHARGE SUMMARY   ADMISSION DIAGNOSES:  1.  Degenerative arthritis left knee.  2.  Hypertension.   DISCHARGE DIAGNOSES:  1.  Degenerative arthritis left knee status post left total knee      arthroplasty.  2.  Hypertension.   PROCEDURES:  The patient was taken to the operating room on June 12, 2005 to  undergo a left total knee arthroplasty. Surgeon:  Worthy Rancher, M.D.  Assistant:  Terie Purser, P.A.-C. Surgery was performed under general  anesthesia. Hemovac drain was placed at the time of surgery.   BRIEF HISTORY:  This is an 77 year old female who has had left knee pain for  several months. She has had increasing pain in her left knee. She had left  knee arthroscopy in February 2006. She has continued to have pain despite  the meniscectomy. She was found to have significant arthritis at the time of  her knee arthroscopy and elects to proceed with a left total knee  arthroplasty, was admitted to hospital for same.   LABORATORY DATA:  Admission CBC:  WBC 7.8, RBC 3.95, hemoglobin 12.3,  hematocrit 35.2 - slightly low, platelet count 237. Admission PT 12.8, INR  0.9, PTT 29. Admission chemistry:  Sodium is 138, potassium 4.1, chloride  100, CO2 27, glucose 103, BUN 18, creatinine 1.0, calcium 9.7, total protein  7.1. Admission urinalysis is normal. The patient's blood type is O positive,  negative antibody screen. Admission EKG:  Normal sinus rhythm with a left  axis deviation, rate of 64. Preoperative x-ray of left knee revealed  osteoarthritic changes medial compartment of left knee. Postoperative x-ray  of left knee reveals satisfactory  alignment of left total knee prosthesis.   HOSPITAL COURSE:  The patient was admitted to [facility]. She was  taken to the operating room. She underwent the above-stated procedure  without complication. She tolerated the procedure well and was allowed to  return to the recovery room and then the orthopedic floor to continue her  postoperative care. The patient was placed on PC analgesia for pain control.  The patient's pain was well controlled and the PCA was discontinued on  postoperative day #3. Hemovac drain was removed on postoperative day #1. The  patient's hemoglobin and hematocrit was followed throughout her  hospitalization. She had a postoperative drop in her hemoglobin to 9.3 but  that stabilized prior to discharge and she did not require a blood  transfusion. PT and OT was consulted for gait training ambulation. The  patient was a little slow to progress and it was felt that she could benefit  from additional rehab or SACU admission. A bed became available on July 1  and the patient was discharged to [facility].  CONDITION AT THE TIME OF TRANSFER:  Stable.   FOLLOW-UP:  The patient will follow up with Dr. Darrelyn Hillock 2 weeks from the  date of surgery. Sutures will be removed 2 weeks from the date of surgery.   WOUND CARE:  Daily dressing changes.   ACTIVITY:  Full weightbearing with walker.   DIET:  No restrictions.   CONDITION AT THE TIME OF TRANSFER:  Stable.      Haynes Hoehn  D:  07/17/2005  T:  07/18/2005  Job:  [id number]

## 2011-05-03 NOTE — Op Note (Signed)
NAME:  Tamara [name], Tamara [name]                            ACCOUNT NO.:  [id number]   MEDICAL RECORD NO.:  [id number]                   PATIENT TYPE:  INP   LOCATION:  0449                                 FACILITY:  [facility]   PHYSICIAN:  Pershing Cox, M.D.            DATE OF BIRTH:  09/22/1920   DATE OF PROCEDURE:  11/24/2003  DATE OF DISCHARGE:                                 OPERATIVE REPORT   PREOPERATIVE DIAGNOSIS:  Adenocarcinoma of the endometrium.   POSTOPERATIVE DIAGNOSES:  Adenocarcinoma of the endometrium and extensive  adhesive disease of the rectosigmoid to the left adnexa.   PROCEDURE:  Exploratory laparotomy, total abdominal hysterectomy, bilateral  salpingo-oophorectomy, extensive lysis of adhesions of rectosigmoid and of  omentum to previous scar site.   ANESTHESIA:  General endotracheal.   SURGEON:  Pershing Cox, M.D.   ASSISTANT:  Richardean Sale, M.D.   INDICATIONS FOR PROCEDURE:  The patient is 77 years old.  She presented with  a profound watery discharge which had been present for many many months.  This lead to a workup which culminated in a D&C hysteroscopy.  This  hysteroscopy was performed on October 15.  The patient was found to have a  10 cm cavity and endometrium showing well differentiated adenocarcinoma  associated with complex hyperplasia with atypia.  She was brought to the  operating room today to undergo hysterectomy as treatment of this disease.  She was seen in consultation by her family physician, Dr. Rosezetta Schlatter who had  cleared her for surgery.   FINDINGS:  There was no evidence of ascites.  Exploratory laparotomy showed  a looped adhesion of the omentum to the anterior abdominal wall along the  patient's previous scar site. This was taken down by sharp dissection. The  uterus itself was approximately eight weeks in size with evidence of a small  submucosal myoma.  Both ovaries were normal in size.  The left adnexa was  wrapped with dense  adhesions to the rectosigmoid in what appeared to be a  previous inflammatory process. It was otherwise normal and the fallopian  tube was normal.  There was no evidence or spread of tumor outside the  endometrial cavity.  The peritoneal surfaces were smooth.  The bowel was run  from the ligament of Treitz to the ileocecal valve and there was no evidence  of abnormality.  The omentum was normal other than its adhesions.  Opening  the side wall, the vessels were inspected all the way down to the obturator  nerve.  There was no evidence of enlarged pelvic lymph node.   DESCRIPTION OF PROCEDURE:  [name] was brought to the operating room with  an IV in place.  She received a gram of Ancef while on the operating room  table.  PAS stockings had been placed to her thighs in the waiting area and  were connected to compressive device while in  the operating room.  Supine on  the OR table, general endotracheal anesthesia was administered without  difficulty.  The patient was placed in the frog leg position and the  anterior abdominal wall, perineum and vagina were prepped with a solution of  Hibiclens.  The Foley catheter was sterilely inserted into the bladder for  drainage.   The patient was draped for a sterile midline incision and Bookwalter  retractor was placed for usage during the procedure.  The patient's anterior  abdominal wall was marked for a vertical incision.  A 0.25%  Marcaine was  injected into the proposed incision site.  An 8 cm incision was then created  carrying down through the subcutaneous tissues to identify the fascia. The  fascia was very thin, it was scored in the midline and then the fascial  incision was extended with Mayo scissors.  The rectus muscles were divided  in the midline and the peritoneum was opened atraumatically.  Peritoneal  edges were lifted and 500 mL of warm saline were instilled and then  collected for peritoneal washings.  Using a sleeved arm, the  upper abdomen  was explored. Both liver surfaces and diaphragms were normal.  There was no  evidence of periaortic adenopathy.  The omentum was found to be densely  adherent to the anterior abdominal wall in the left upper abdomen consistent  with previous scar from her appendectomy.  This was taken down by sharp  dissection and later in the case, the omentum was carefully inspected and  there was no evidence of bleeding.   The patient was then placed into a slight Trendelenburg position.  The  Bookwalter retractor was placed.  The gutters were packed and the bowel was  lifted at the pelvis and compressed with moist laps.  Long Kelly's were  placed along the perimetrium and the pelvis was carefully inspected.  The  round ligaments were identified, suture ligated and transected with cautery.  On the patient's right, the peritoneum was opened lateral to the ovarian  vessels.  The peritoneum was then separated and the ureter was identified.  It was very deep to the vessels. The IP ligament was identified, clamped,  cut, suture and free tie ligated.  The broad ligament was dived underneath  the ovary and fallopian tube and they reflected onto the surface of the  uterus.   On the patient's left, the round ligament had previously been ligated.  We  could not in any way, shape or form identify the ovary and fallopian tube.  These structures and the IP ligament were buried beneath multiple dense  adhesions of the rectosigmoid.  Approximately 1/2 an hour was taken to  dissect these adhesions from the pelvic sidewall freeing the IP ligament,  ovary and fallopian tubes.  This was compatible with a previous inflammation  probably from the rectosigmoid as there was no evidence of hydrosalpinx.  The fallopian tube and ovary were dissected free from the posterior broad  ligament.  At this point, it was possible to identify the ureter and the IP ligament was gathered as a vascular pedicle, clamped, cut  and suture free  tie ligated.  The tissue beneath the ovary and fallopian tube were dissected  and the ovary and fallopian tube were reflected onto the uterine surface.   The peritoneum overlying the lower uterine segment was identified and  opened.  The bladder was separated from the lower uterine segment and upper  cervix.  The uterine arteries were skeletonized ad  then Masterson clamps  were used to clamp, cut and suture these arteries.  A second path was placed  around the uterine artery on each side to secure this pedicle. Straight  Masterson clamps were used to separate the cardinal ligament from the lower  uterine segment.  Each of these pedicles were Heaney ligated.  On the  patient's right, the uterosacral ligaments was identified, clamped and  transected.  This gave [place] entry into the patient's vagina.  The suture was  Heaney ligated and then held for later in the case.  The cervix was freed  from the vagina. The side wall was identified with a Kocher clamp as the  specimen was lifted and inspected.  A Richardson imbricating stitch was  placed on the left to gather the vaginal angle here.  A similar suture was  placed on the patient's right.  #0 Vicryl was used to whip stitch the  vaginal edges and the vagina was closed front to back with two interrupted  stitches.  There was no evidence of bleeding from the dissected area  especially in the left lower quadrant.   At this point, I used the round ligament to guide the dissection of the  pelvic cavities.  Both external iliac arteries were identified. The sidewall  was opened lateral to these vessels and the vessel and the underlying vein  were carefully inspected. There were fat globules but no evidence of  identifiable lymph node and certainly no lymph node enlargement.  Beneath  the vein I was able to identify the obturator vessels and obturator nerve.  There was no evidence of lymph node enlargement in this area either.    Common iliac's were carefully inspected. The surfaces of the IVC and the  aorta were carefully inspected.  There was no evidence of adenopathy.   Irrigation confirmed small bleeders and these were cauterized and carefully  inspected prior to closing the patient's abdomen.  The bowel was run again  and the omentum was again inspected and layered over the small bowel.  The  anterior abdominal wall was closed with a running double stranded #0 Prolene  suture starting from the top and then meeting in the middle with the second  stitch.  The stitch was buried.  A Vicryl stitch was used to close the  subcutaneous tissues over the Prolene stitch.  Skin staples were then  applied.  A pressure bandage was applied over the bandages.   Estimated blood loss 225 mL, urine output 300 mL.  Fluids 2400 mL.  Complications none.                                               Pershing Cox, M.D.   MAJ/MEDQ  D:  11/24/2003  T:  11/24/2003  Job:  [id number]   cc:   Marjory Lies, M.D.  [id number]  [place]  [place] [address]  Fax: [phone]   Richardean Sale, M.D.

## 2011-08-03 ENCOUNTER — Emergency Department (HOSPITAL_COMMUNITY): Payer: Medicare Other

## 2011-08-03 ENCOUNTER — Emergency Department (HOSPITAL_COMMUNITY)
Admission: EM | Admit: 2011-08-03 | Discharge: 2011-08-03 | Disposition: A | Discharge status: Home / Self Care | Payer: Medicare Other | Attending: Emergency Medicine | Admitting: Emergency Medicine

## 2011-08-03 DIAGNOSIS — M545 Low back pain, unspecified: Secondary | ICD-10-CM | POA: Insufficient documentation

## 2011-08-03 DIAGNOSIS — G309 Alzheimer's disease, unspecified: Secondary | ICD-10-CM | POA: Insufficient documentation

## 2011-08-03 DIAGNOSIS — R319 Hematuria, unspecified: Secondary | ICD-10-CM | POA: Insufficient documentation

## 2011-08-03 DIAGNOSIS — R059 Cough, unspecified: Secondary | ICD-10-CM | POA: Insufficient documentation

## 2011-08-03 DIAGNOSIS — W1809XA Striking against other object with subsequent fall, initial encounter: Secondary | ICD-10-CM | POA: Insufficient documentation

## 2011-08-03 DIAGNOSIS — S3210XA Unspecified fracture of sacrum, initial encounter for closed fracture: Secondary | ICD-10-CM | POA: Insufficient documentation

## 2011-08-03 DIAGNOSIS — N390 Urinary tract infection, site not specified: Secondary | ICD-10-CM | POA: Insufficient documentation

## 2011-08-03 DIAGNOSIS — Y92009 Unspecified place in unspecified non-institutional (private) residence as the place of occurrence of the external cause: Secondary | ICD-10-CM | POA: Insufficient documentation

## 2011-08-03 DIAGNOSIS — R05 Cough: Secondary | ICD-10-CM | POA: Insufficient documentation

## 2011-08-03 DIAGNOSIS — F0280 Dementia in other diseases classified elsewhere without behavioral disturbance: Secondary | ICD-10-CM | POA: Insufficient documentation

## 2011-08-03 LAB — BASIC METABOLIC PANEL
BUN: 21 mg/dL (ref 6–23)
Chloride: 101 mEq/L (ref 96–112)
GFR calc Af Amer: 50 mL/min — ABNORMAL LOW (ref >60)
Glucose, Bld: 99 mg/dL (ref 70–99)
Potassium: 3.8 mEq/L (ref 3.5–5.1)
Sodium: 138 mEq/L (ref 135–145)

## 2011-08-03 LAB — DIFFERENTIAL
Lymphocytes Relative: 24 % (ref 12–46)
Lymphs Abs: 1.8 10*3/uL (ref 0.7–4.0)
Monocytes Relative: 12 % (ref 3–12)
Neutro Abs: 4.4 10*3/uL (ref 1.7–7.7)
Neutrophils Relative %: 59 % (ref 43–77)

## 2011-08-03 LAB — URINE MICROSCOPIC-ADD ON

## 2011-08-03 LAB — CBC
HCT: 34.9 % — ABNORMAL LOW (ref 36.0–46.0)
Hemoglobin: 11.9 g/dL — ABNORMAL LOW (ref 12.0–15.0)
MCH: 30.4 pg (ref 26.0–34.0)
MCV: 89.3 fL (ref 78.0–100.0)
RBC: 3.91 MIL/uL (ref 3.87–5.11)

## 2011-08-03 LAB — URINALYSIS, ROUTINE W REFLEX MICROSCOPIC
Bilirubin Urine: NEGATIVE
Nitrite: NEGATIVE
Specific Gravity, Urine: 1.014 (ref 1.005–1.030)
Urobilinogen, UA: 0.2 mg/dL (ref 0.0–1.0)
pH: 6.5 (ref 5.0–8.0)

## 2011-09-12 LAB — BASIC METABOLIC PANEL
BUN: 16
CO2: 27
Calcium: 9.1
Chloride: 106
GFR calc non Af Amer: 54 — ABNORMAL LOW
Glucose, Bld: 101 — ABNORMAL HIGH
Glucose, Bld: 93
Potassium: 3.8
Sodium: 138
Sodium: 139

## 2011-09-12 LAB — COMPREHENSIVE METABOLIC PANEL
Albumin: 3.7
BUN: 16
Calcium: 9.5
Creatinine, Ser: 0.96
Glucose, Bld: 99
Potassium: 3.4 — ABNORMAL LOW
Total Protein: 6.3

## 2011-09-12 LAB — DIFFERENTIAL
Basophils Relative: 0
Lymphocytes Relative: 10 — ABNORMAL LOW
Lymphs Abs: 1.5
Monocytes Absolute: 1.1 — ABNORMAL HIGH
Monocytes Relative: 7
Neutro Abs: 12.1 — ABNORMAL HIGH
Neutrophils Relative %: 81 — ABNORMAL HIGH

## 2011-09-12 LAB — CBC
Hemoglobin: 13.2
MCHC: 34.2
RDW: 12.8

## 2011-09-12 LAB — CK TOTAL AND CKMB (NOT AT ARMC)
CK, MB: 13 — ABNORMAL HIGH
CK, MB: 8.3 — ABNORMAL HIGH
Relative Index: 1.1
Relative Index: 1.5
Total CK: 890 — ABNORMAL HIGH

## 2011-09-12 LAB — CARDIAC PANEL(CRET KIN+CKTOT+MB+TROPI)
CK, MB: 2.4
CK, MB: 5.1 — ABNORMAL HIGH
Total CK: 622 — ABNORMAL HIGH
Troponin I: 0.03

## 2011-09-12 LAB — TROPONIN I: Troponin I: 0.02

## 2012-02-08 IMAGING — CR DG PELVIS 1-2V
1 series · 1 of 1 positions shown · non-contrast
Comparison: 04/10/2011 abdominal radiograph

CLINICAL DATA: Low back pain, fall last night

PELVIS - 1-2 VIEW

[t pelvis a.p.]
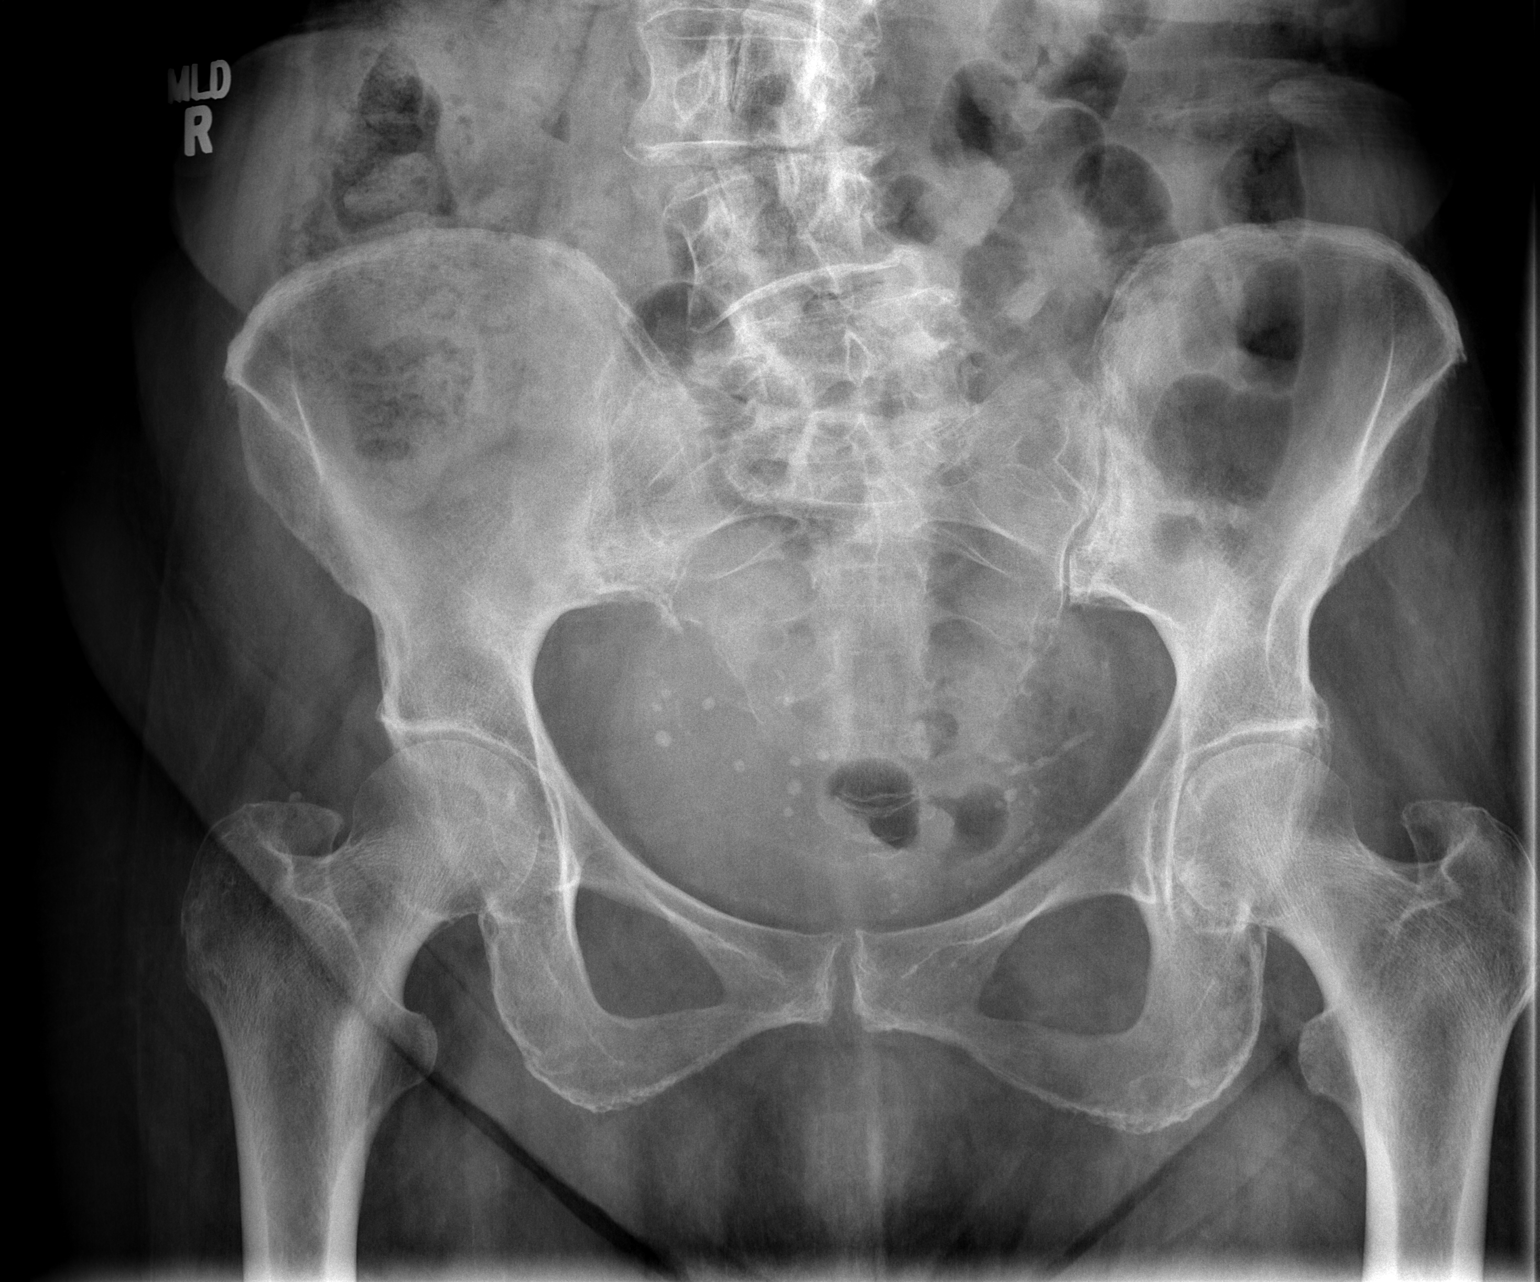

[1 of 1 positions shown; findings below may reference images not displayed]

FINDINGS: Dextrorotatory scoliosis of the lumbar spine is partly
visualized.  No displaced pelvic fracture.  Proximal femora are
grossly unremarkable their visualized aspects.  Normal visualized
bowel gas pattern.
IMPRESSION: No displaced pelvic fracture.

## 2013-12-16 DEATH — deceased

## 2013-12-31 ENCOUNTER — Telehealth: Payer: Self-pay

## 2013-12-31 NOTE — Telephone Encounter (Signed)
[facility] per Ileene HutchinsonObituary in [employer]

## 2014-10-26 ENCOUNTER — Encounter: Payer: Self-pay | Admitting: Internal Medicine
